# Patient Record
Sex: Female | Born: 1945 | Race: Black or African American | Hispanic: No | Marital: Single | Smoking: Former smoker
Health system: Southern US, Community
[De-identification: ages and names within clinical notes are randomized; demographics above are authoritative.]

## PROBLEM LIST (undated history)

## (undated) DIAGNOSIS — Z789 Other specified health status: Secondary | ICD-10-CM

## (undated) HISTORY — PX: NO PAST SURGERIES: SHX2092

---

## 2006-05-29 ENCOUNTER — Emergency Department (HOSPITAL_COMMUNITY): Admission: EM | Admit: 2006-05-29 | Discharge: 2006-05-30 | Payer: Self-pay | Admitting: Emergency Medicine

## 2009-04-15 ENCOUNTER — Emergency Department (HOSPITAL_BASED_OUTPATIENT_CLINIC_OR_DEPARTMENT_OTHER): Admission: EM | Admit: 2009-04-15 | Discharge: 2009-04-15 | Payer: Self-pay | Admitting: Emergency Medicine

## 2010-11-05 ENCOUNTER — Emergency Department (HOSPITAL_COMMUNITY)
Admission: EM | Admit: 2010-11-05 | Discharge: 2010-11-06 | Payer: Self-pay | Source: Home / Self Care | Admitting: Emergency Medicine

## 2010-11-06 DIAGNOSIS — F4320 Adjustment disorder, unspecified: Secondary | ICD-10-CM

## 2010-11-06 DIAGNOSIS — F29 Unspecified psychosis not due to a substance or known physiological condition: Secondary | ICD-10-CM

## 2010-11-07 LAB — CBC
HCT: 43.3 % (ref 36.0–46.0)
Hemoglobin: 14.4 g/dL (ref 12.0–15.0)
MCH: 31.4 pg (ref 26.0–34.0)
MCHC: 33.3 g/dL (ref 30.0–36.0)
MCV: 94.5 fL (ref 78.0–100.0)
Platelets: 329 10*3/uL (ref 150–400)
RBC: 4.58 MIL/uL (ref 3.87–5.11)
RDW: 14.4 % (ref 11.5–15.5)
WBC: 4 10*3/uL (ref 4.0–10.5)

## 2010-11-07 LAB — URINALYSIS, ROUTINE W REFLEX MICROSCOPIC
Bilirubin Urine: NEGATIVE
Hgb urine dipstick: NEGATIVE
Ketones, ur: NEGATIVE mg/dL
Nitrite: NEGATIVE
Protein, ur: NEGATIVE mg/dL
Specific Gravity, Urine: 1.016 (ref 1.005–1.030)
Urine Glucose, Fasting: NEGATIVE mg/dL
Urobilinogen, UA: 0.2 mg/dL (ref 0.0–1.0)
pH: 5.5 (ref 5.0–8.0)

## 2010-11-07 LAB — COMPREHENSIVE METABOLIC PANEL
ALT: 14 U/L (ref 0–35)
AST: 26 U/L (ref 0–37)
Albumin: 4.4 g/dL (ref 3.5–5.2)
Alkaline Phosphatase: 56 U/L (ref 39–117)
BUN: 8 mg/dL (ref 6–23)
CO2: 23 mEq/L (ref 19–32)
Calcium: 9.4 mg/dL (ref 8.4–10.5)
Chloride: 111 mEq/L (ref 96–112)
Creatinine, Ser: 0.88 mg/dL (ref 0.4–1.2)
GFR calc Af Amer: >60 mL/min (ref >60)
GFR calc non Af Amer: >60 mL/min (ref >60)
Glucose, Bld: 88 mg/dL (ref 70–99)
Potassium: 3.6 mEq/L (ref 3.5–5.1)
Sodium: 147 mEq/L — ABNORMAL HIGH (ref 135–145)
Total Bilirubin: 0.5 mg/dL (ref 0.3–1.2)
Total Protein: 8 g/dL (ref 6.0–8.3)

## 2010-11-07 LAB — DIFFERENTIAL
Basophils Absolute: 0.1 10*3/uL (ref 0.0–0.1)
Basophils Relative: 2 % — ABNORMAL HIGH (ref 0–1)
Eosinophils Absolute: 0 10*3/uL (ref 0.0–0.7)
Eosinophils Relative: 1 % (ref 0–5)
Lymphocytes Relative: 47 % — ABNORMAL HIGH (ref 12–46)
Lymphs Abs: 1.9 10*3/uL (ref 0.7–4.0)
Monocytes Absolute: 0.1 10*3/uL (ref 0.1–1.0)
Monocytes Relative: 3 % (ref 3–12)
Neutro Abs: 1.9 10*3/uL (ref 1.7–7.7)
Neutrophils Relative %: 48 % (ref 43–77)

## 2010-11-07 LAB — RAPID URINE DRUG SCREEN, HOSP PERFORMED
Amphetamines: NOT DETECTED
Barbiturates: NOT DETECTED
Benzodiazepines: NOT DETECTED
Cocaine: NOT DETECTED
Opiates: NOT DETECTED
Tetrahydrocannabinol: NOT DETECTED

## 2010-11-07 LAB — ACETAMINOPHEN LEVEL: Acetaminophen (Tylenol), Serum: <10 ug/mL — ABNORMAL LOW (ref 10–30)

## 2010-11-07 LAB — SALICYLATE LEVEL: Salicylate Lvl: <4 mg/dL (ref 2.8–20.0)

## 2010-11-07 LAB — URINE MICROSCOPIC-ADD ON

## 2010-11-07 LAB — ETHANOL: Alcohol, Ethyl (B): 254 mg/dL — ABNORMAL HIGH (ref 0–10)

## 2010-12-27 ENCOUNTER — Emergency Department (HOSPITAL_COMMUNITY)
Admission: EM | Admit: 2010-12-27 | Discharge: 2010-12-29 | Disposition: A | Discharge status: Other Acute Inpatient Hospital | Payer: Medicare Other | Source: Home / Self Care | Attending: Emergency Medicine | Admitting: Emergency Medicine

## 2010-12-27 DIAGNOSIS — Z8659 Personal history of other mental and behavioral disorders: Secondary | ICD-10-CM | POA: Insufficient documentation

## 2010-12-27 DIAGNOSIS — F1010 Alcohol abuse, uncomplicated: Secondary | ICD-10-CM | POA: Insufficient documentation

## 2010-12-27 LAB — COMPREHENSIVE METABOLIC PANEL
ALT: 11 U/L (ref 0–35)
AST: 20 U/L (ref 0–37)
Albumin: 4.3 g/dL (ref 3.5–5.2)
Alkaline Phosphatase: 41 U/L (ref 39–117)
BUN: 7 mg/dL (ref 6–23)
CO2: 25 mEq/L (ref 19–32)
Calcium: 9.2 mg/dL (ref 8.4–10.5)
Chloride: 110 mEq/L (ref 96–112)
Creatinine, Ser: 0.83 mg/dL (ref 0.4–1.2)
GFR calc Af Amer: >60 mL/min (ref >60)
GFR calc non Af Amer: >60 mL/min (ref >60)
Glucose, Bld: 102 mg/dL — ABNORMAL HIGH (ref 70–99)
Potassium: 3.5 mEq/L (ref 3.5–5.1)
Sodium: 146 mEq/L — ABNORMAL HIGH (ref 135–145)
Total Bilirubin: 0.4 mg/dL (ref 0.3–1.2)
Total Protein: 7.5 g/dL (ref 6.0–8.3)

## 2010-12-27 LAB — RAPID URINE DRUG SCREEN, HOSP PERFORMED
Amphetamines: NOT DETECTED
Barbiturates: NOT DETECTED
Benzodiazepines: NOT DETECTED
Cocaine: NOT DETECTED
Opiates: NOT DETECTED
Tetrahydrocannabinol: NOT DETECTED

## 2010-12-27 LAB — CBC
HCT: 41.2 % (ref 36.0–46.0)
Hemoglobin: 13.9 g/dL (ref 12.0–15.0)
MCH: 32.3 pg (ref 26.0–34.0)
MCHC: 33.7 g/dL (ref 30.0–36.0)
MCV: 95.6 fL (ref 78.0–100.0)
Platelets: 319 10*3/uL (ref 150–400)
RBC: 4.31 MIL/uL (ref 3.87–5.11)
RDW: 14.3 % (ref 11.5–15.5)
WBC: 6.6 10*3/uL (ref 4.0–10.5)

## 2010-12-27 LAB — DIFFERENTIAL
Basophils Absolute: 0 10*3/uL (ref 0.0–0.1)
Basophils Relative: 1 % (ref 0–1)
Eosinophils Absolute: 0 10*3/uL (ref 0.0–0.7)
Eosinophils Relative: 0 % (ref 0–5)
Lymphocytes Relative: 24 % (ref 12–46)
Lymphs Abs: 1.6 10*3/uL (ref 0.7–4.0)
Monocytes Absolute: 0.4 10*3/uL (ref 0.1–1.0)
Monocytes Relative: 5 % (ref 3–12)
Neutro Abs: 4.6 10*3/uL (ref 1.7–7.7)
Neutrophils Relative %: 70 % (ref 43–77)

## 2010-12-27 LAB — ETHANOL: Alcohol, Ethyl (B): 185 mg/dL — ABNORMAL HIGH (ref 0–10)

## 2010-12-28 DIAGNOSIS — F29 Unspecified psychosis not due to a substance or known physiological condition: Secondary | ICD-10-CM

## 2010-12-29 ENCOUNTER — Inpatient Hospital Stay (HOSPITAL_COMMUNITY)
Admission: AD | Admit: 2010-12-29 | Discharge: 2011-01-03 | DRG: 897 | Disposition: A | Discharge status: Home / Self Care | Payer: Medicare Other | Source: Ambulatory Visit | Attending: Psychiatry | Admitting: Psychiatry

## 2010-12-29 DIAGNOSIS — F29 Unspecified psychosis not due to a substance or known physiological condition: Secondary | ICD-10-CM

## 2010-12-29 DIAGNOSIS — I10 Essential (primary) hypertension: Secondary | ICD-10-CM

## 2010-12-29 DIAGNOSIS — F1010 Alcohol abuse, uncomplicated: Secondary | ICD-10-CM

## 2010-12-29 DIAGNOSIS — F10988 Alcohol use, unspecified with other alcohol-induced disorder: Principal | ICD-10-CM

## 2011-01-03 DIAGNOSIS — F1010 Alcohol abuse, uncomplicated: Secondary | ICD-10-CM

## 2011-01-03 DIAGNOSIS — F39 Unspecified mood [affective] disorder: Secondary | ICD-10-CM
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
## 2011-01-30 LAB — URINALYSIS, ROUTINE W REFLEX MICROSCOPIC
Bilirubin Urine: NEGATIVE
Glucose, UA: NEGATIVE mg/dL
Hgb urine dipstick: NEGATIVE
Ketones, ur: NEGATIVE mg/dL
Nitrite: NEGATIVE
Protein, ur: NEGATIVE mg/dL
Specific Gravity, Urine: 1.003 — ABNORMAL LOW (ref 1.005–1.030)
Urobilinogen, UA: 0.2 mg/dL (ref 0.0–1.0)
pH: 5.5 (ref 5.0–8.0)

## 2011-01-30 LAB — BASIC METABOLIC PANEL
BUN: 9 mg/dL (ref 6–23)
CO2: 21 mEq/L (ref 19–32)
Calcium: 9.1 mg/dL (ref 8.4–10.5)
Chloride: 114 mEq/L — ABNORMAL HIGH (ref 96–112)
Creatinine, Ser: 0.9 mg/dL (ref 0.4–1.2)
GFR calc Af Amer: >60 mL/min (ref >60)
GFR calc non Af Amer: >60 mL/min (ref >60)
Glucose, Bld: 126 mg/dL — ABNORMAL HIGH (ref 70–99)
Potassium: 3.6 mEq/L (ref 3.5–5.1)
Sodium: 148 mEq/L — ABNORMAL HIGH (ref 135–145)

## 2011-01-30 LAB — POCT TOXICOLOGY PANEL

## 2011-01-30 LAB — URINE MICROSCOPIC-ADD ON

## 2011-01-30 LAB — DIFFERENTIAL
Basophils Absolute: 0.1 10*3/uL (ref 0.0–0.1)
Basophils Relative: 1 % (ref 0–1)
Eosinophils Absolute: 0 10*3/uL (ref 0.0–0.7)
Eosinophils Relative: 0 % (ref 0–5)
Lymphocytes Relative: 23 % (ref 12–46)
Lymphs Abs: 1.3 10*3/uL (ref 0.7–4.0)
Monocytes Absolute: 0.2 10*3/uL (ref 0.1–1.0)
Monocytes Relative: 4 % (ref 3–12)
Neutro Abs: 4.1 10*3/uL (ref 1.7–7.7)
Neutrophils Relative %: 72 % (ref 43–77)

## 2011-01-30 LAB — CBC
HCT: 42.4 % (ref 36.0–46.0)
Hemoglobin: 14.2 g/dL (ref 12.0–15.0)
MCHC: 33.4 g/dL (ref 30.0–36.0)
MCV: 89.9 fL (ref 78.0–100.0)
Platelets: 315 10*3/uL (ref 150–400)
RBC: 4.71 MIL/uL (ref 3.87–5.11)
RDW: 14.2 % (ref 11.5–15.5)
WBC: 5.7 10*3/uL (ref 4.0–10.5)

## 2011-01-30 LAB — ETHANOL: Alcohol, Ethyl (B): 109 mg/dL — ABNORMAL HIGH (ref 0–10)

## 2012-10-16 IMAGING — CR DG CHEST 2V
2 series · 2 of 2 positions shown · non-contrast
Comparison: Two-view chest x-ray 05/29/2006.

CLINICAL DATA: Intoxicated patient for medical clearance.

CHEST - 2 VIEW 11/05/2010:

[w chest pa]
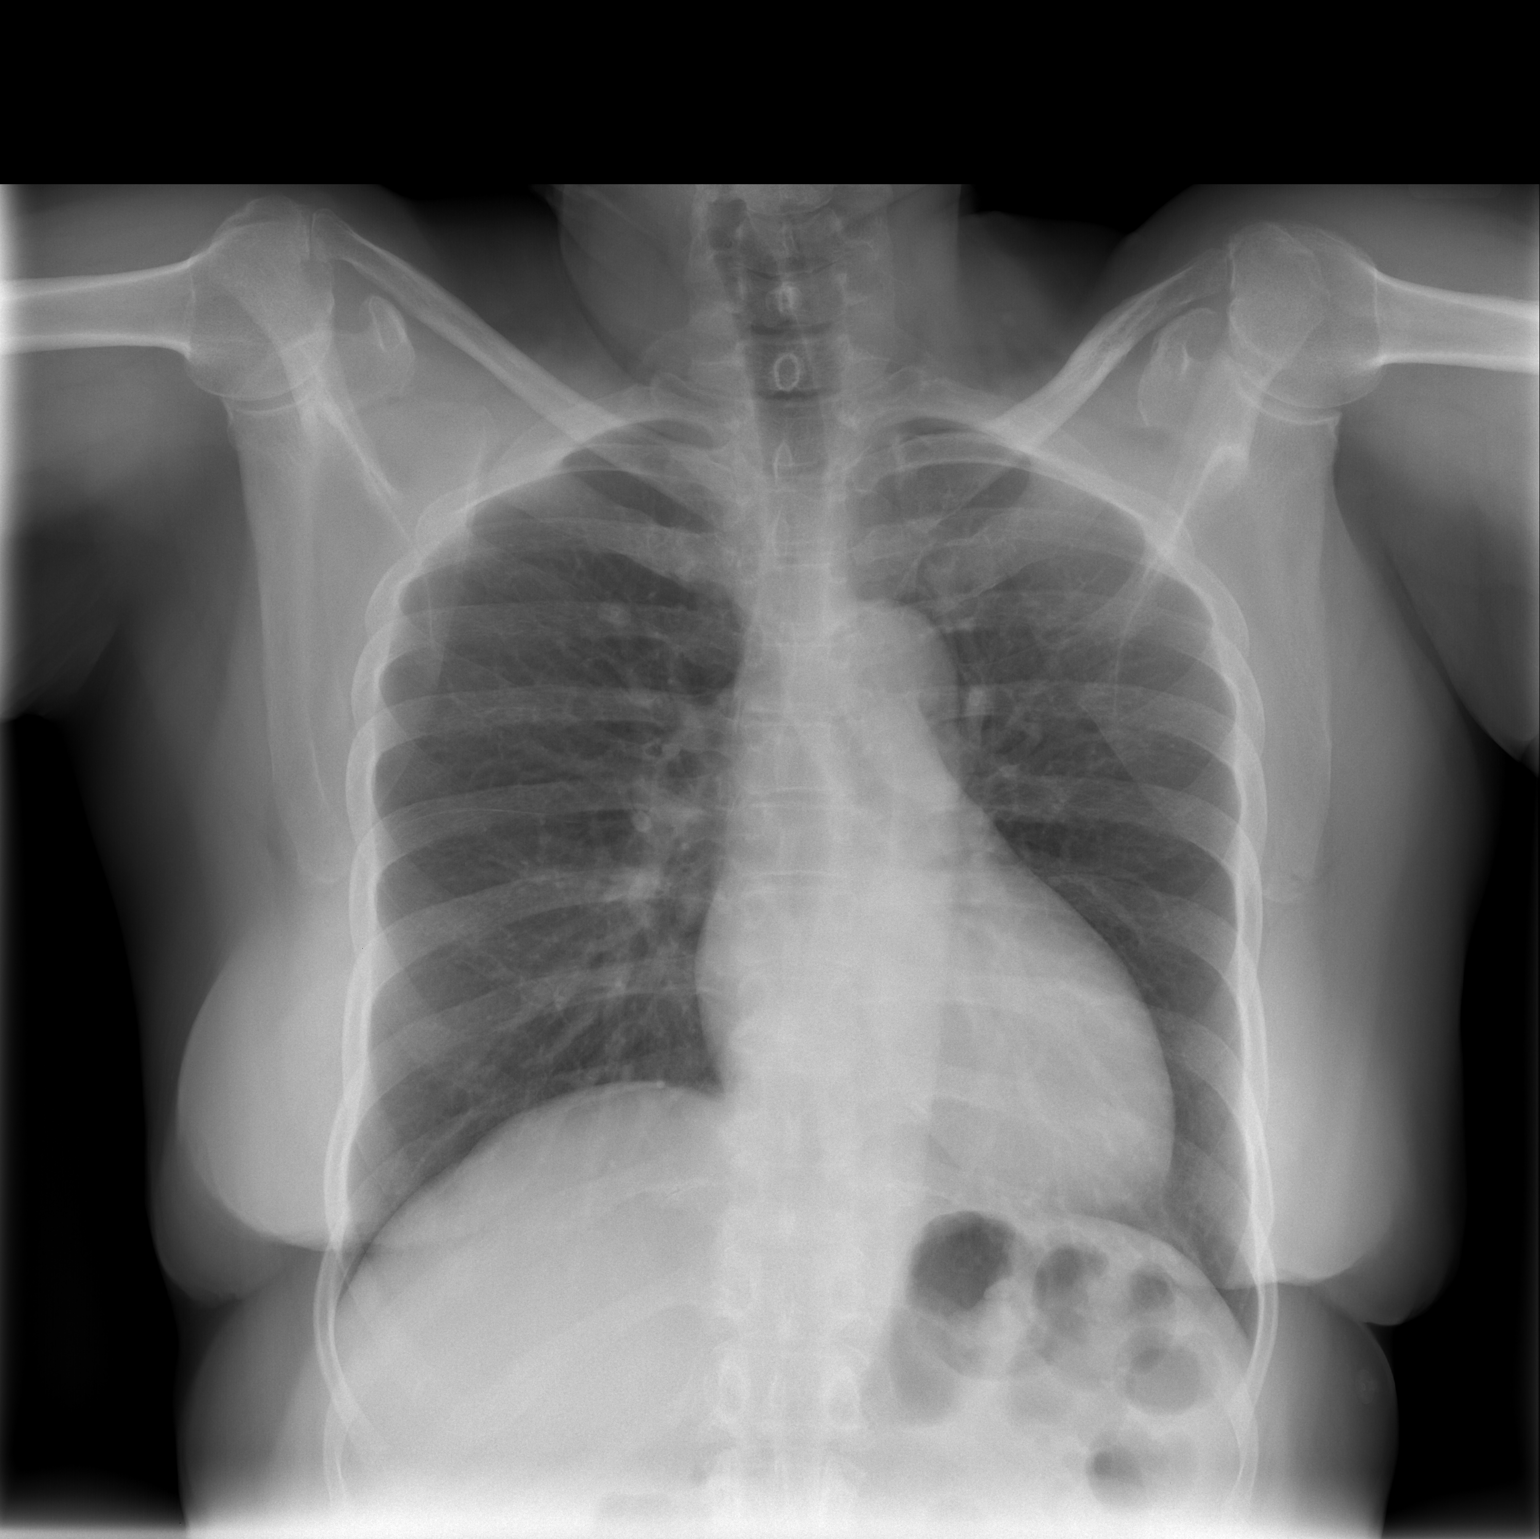

[w chest lat]
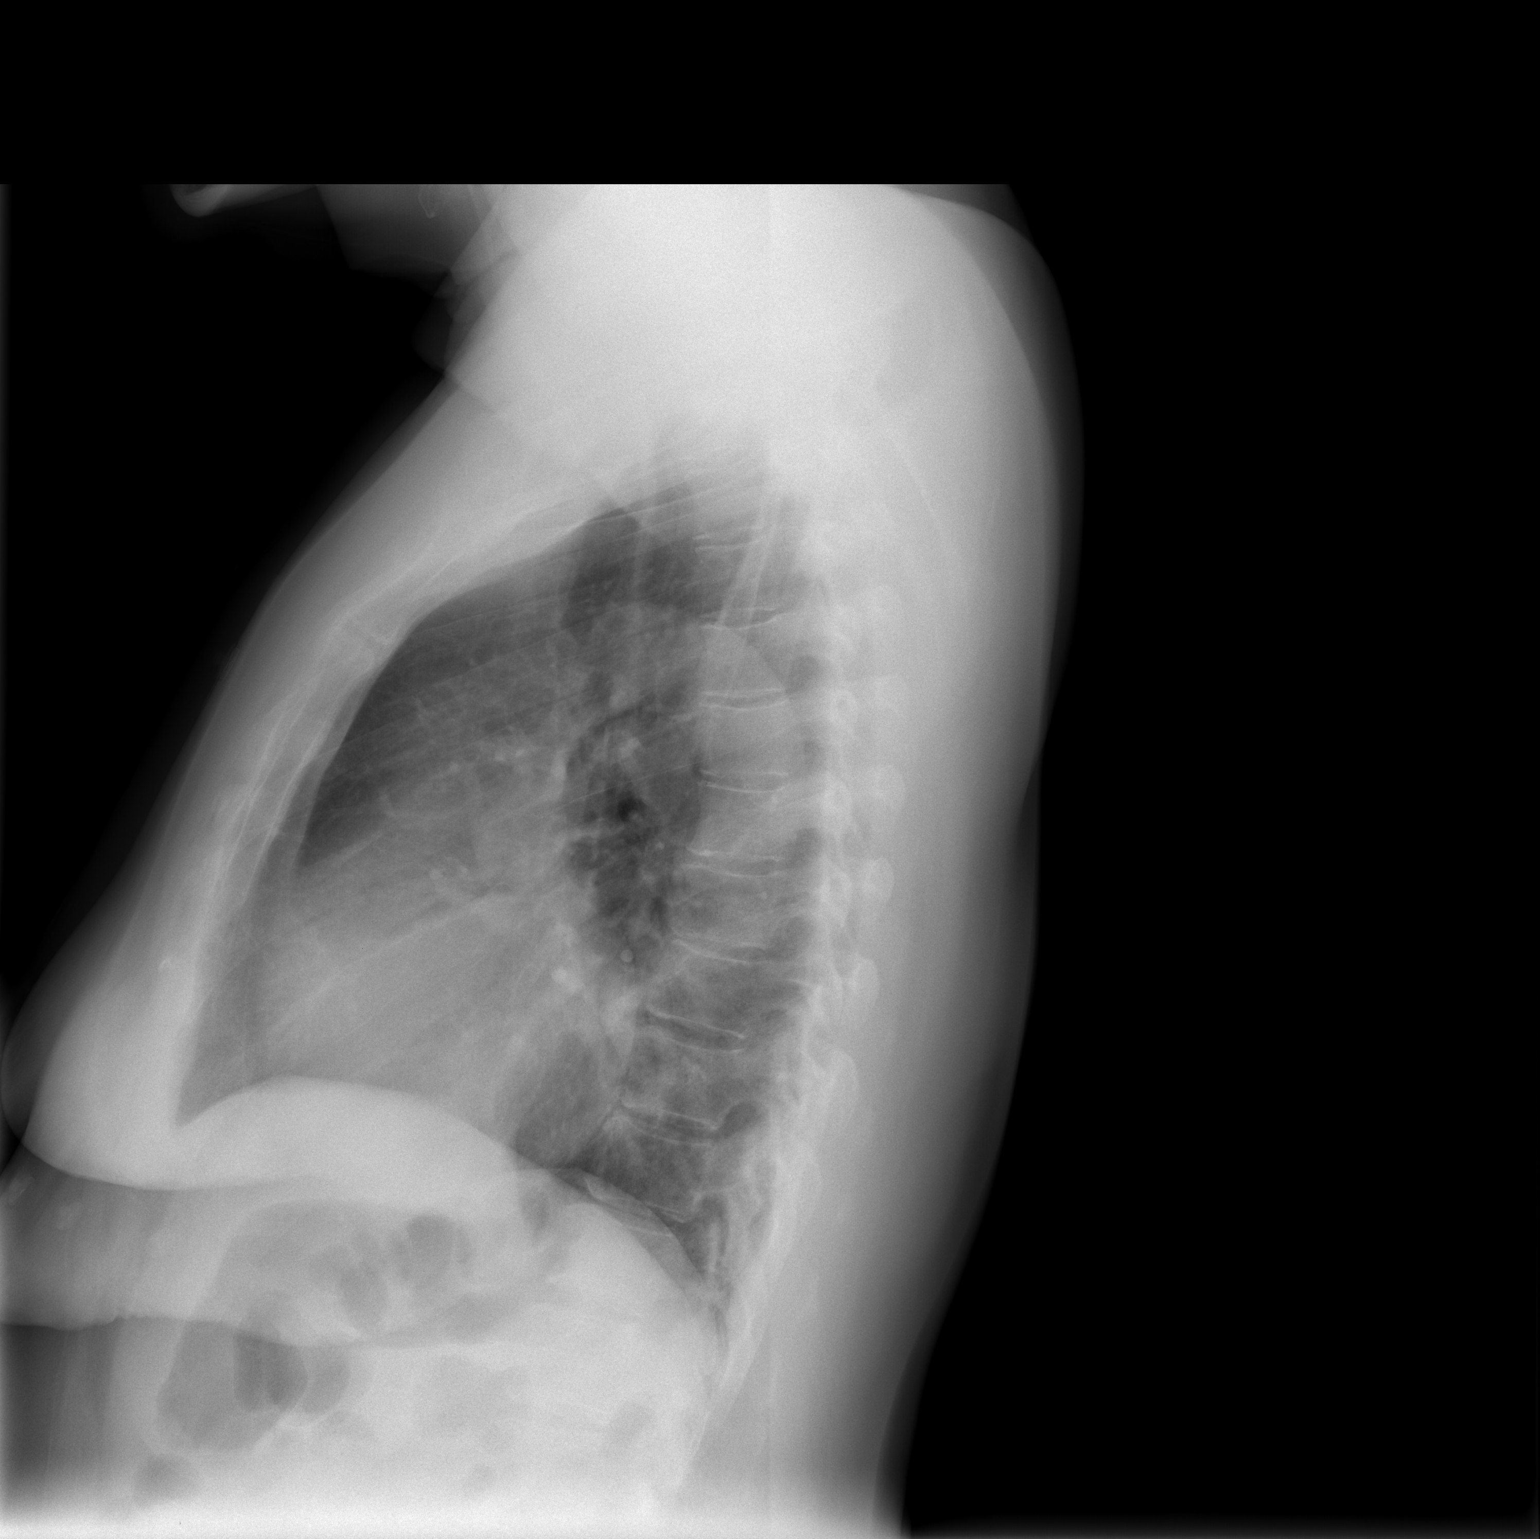

[2 of 2 positions shown; findings below may reference images not displayed]

FINDINGS: Cardiac silhouette mildly enlarged but stable.  Hilar and
mediastinal contours otherwise unremarkable.  Lungs clear.
Bronchovascular markings normal.  Pulmonary vascularity normal.  No
pleural effusions.  Visualized bony thorax intact.  No significant
interval change.
IMPRESSION: Stable mild cardiomegaly.  No acute cardiopulmonary disease.

## 2017-12-30 ENCOUNTER — Inpatient Hospital Stay (HOSPITAL_COMMUNITY)
Admission: EM | Admit: 2017-12-30 | Discharge: 2018-01-04 | DRG: 375 | Disposition: A | Discharge status: Hospice | Payer: Medicare Other | Attending: Internal Medicine | Admitting: Internal Medicine

## 2017-12-30 ENCOUNTER — Emergency Department (HOSPITAL_COMMUNITY): Payer: Medicare Other

## 2017-12-30 ENCOUNTER — Encounter (HOSPITAL_COMMUNITY): Payer: Self-pay | Admitting: Emergency Medicine

## 2017-12-30 ENCOUNTER — Other Ambulatory Visit: Payer: Self-pay

## 2017-12-30 DIAGNOSIS — Z7189 Other specified counseling: Secondary | ICD-10-CM

## 2017-12-30 DIAGNOSIS — J439 Emphysema, unspecified: Secondary | ICD-10-CM | POA: Diagnosis present

## 2017-12-30 DIAGNOSIS — C786 Secondary malignant neoplasm of retroperitoneum and peritoneum: Secondary | ICD-10-CM | POA: Diagnosis not present

## 2017-12-30 DIAGNOSIS — F209 Schizophrenia, unspecified: Secondary | ICD-10-CM | POA: Diagnosis present

## 2017-12-30 DIAGNOSIS — C799 Secondary malignant neoplasm of unspecified site: Secondary | ICD-10-CM | POA: Diagnosis present

## 2017-12-30 DIAGNOSIS — Z681 Body mass index (BMI) 19 or less, adult: Secondary | ICD-10-CM

## 2017-12-30 DIAGNOSIS — N939 Abnormal uterine and vaginal bleeding, unspecified: Secondary | ICD-10-CM | POA: Diagnosis present

## 2017-12-30 DIAGNOSIS — C7989 Secondary malignant neoplasm of other specified sites: Secondary | ICD-10-CM | POA: Diagnosis present

## 2017-12-30 DIAGNOSIS — R770 Abnormality of albumin: Secondary | ICD-10-CM | POA: Diagnosis present

## 2017-12-30 DIAGNOSIS — I700 Atherosclerosis of aorta: Secondary | ICD-10-CM | POA: Diagnosis present

## 2017-12-30 DIAGNOSIS — I10 Essential (primary) hypertension: Secondary | ICD-10-CM | POA: Diagnosis present

## 2017-12-30 DIAGNOSIS — Z87891 Personal history of nicotine dependence: Secondary | ICD-10-CM

## 2017-12-30 DIAGNOSIS — Z515 Encounter for palliative care: Secondary | ICD-10-CM

## 2017-12-30 DIAGNOSIS — E440 Moderate protein-calorie malnutrition: Secondary | ICD-10-CM | POA: Diagnosis present

## 2017-12-30 DIAGNOSIS — C55 Malignant neoplasm of uterus, part unspecified: Secondary | ICD-10-CM | POA: Diagnosis present

## 2017-12-30 DIAGNOSIS — Z8659 Personal history of other mental and behavioral disorders: Secondary | ICD-10-CM

## 2017-12-30 DIAGNOSIS — C787 Secondary malignant neoplasm of liver and intrahepatic bile duct: Secondary | ICD-10-CM | POA: Diagnosis present

## 2017-12-30 DIAGNOSIS — E876 Hypokalemia: Secondary | ICD-10-CM | POA: Diagnosis present

## 2017-12-30 DIAGNOSIS — D649 Anemia, unspecified: Secondary | ICD-10-CM | POA: Diagnosis present

## 2017-12-30 DIAGNOSIS — C7800 Secondary malignant neoplasm of unspecified lung: Secondary | ICD-10-CM | POA: Diagnosis present

## 2017-12-30 DIAGNOSIS — Z5329 Procedure and treatment not carried out because of patient's decision for other reasons: Secondary | ICD-10-CM | POA: Diagnosis not present

## 2017-12-30 DIAGNOSIS — K56600 Partial intestinal obstruction, unspecified as to cause: Secondary | ICD-10-CM | POA: Diagnosis not present

## 2017-12-30 DIAGNOSIS — R591 Generalized enlarged lymph nodes: Secondary | ICD-10-CM | POA: Diagnosis present

## 2017-12-30 DIAGNOSIS — Z809 Family history of malignant neoplasm, unspecified: Secondary | ICD-10-CM

## 2017-12-30 HISTORY — DX: Other specified health status: Z78.9

## 2017-12-30 LAB — COMPREHENSIVE METABOLIC PANEL
ALBUMIN: 2.8 g/dL — AB (ref 3.5–5.0)
ALT: 6 U/L — ABNORMAL LOW (ref 14–54)
AST: 9 U/L — AB (ref 15–41)
Alkaline Phosphatase: 55 U/L (ref 38–126)
Anion gap: 14 (ref 5–15)
BUN: 6 mg/dL (ref 6–20)
CALCIUM: 8.9 mg/dL (ref 8.9–10.3)
CO2: 28 mmol/L (ref 22–32)
Chloride: 94 mmol/L — ABNORMAL LOW (ref 101–111)
Creatinine, Ser: 0.63 mg/dL (ref 0.44–1.00)
GFR calc Af Amer: >60 mL/min (ref >60)
GFR calc non Af Amer: >60 mL/min (ref >60)
GLUCOSE: 122 mg/dL — AB (ref 65–99)
Potassium: 3.2 mmol/L — ABNORMAL LOW (ref 3.5–5.1)
SODIUM: 136 mmol/L (ref 135–145)
Total Bilirubin: 0.7 mg/dL (ref 0.3–1.2)
Total Protein: 7 g/dL (ref 6.5–8.1)

## 2017-12-30 LAB — CBC
HEMATOCRIT: 35.9 % — AB (ref 36.0–46.0)
HEMOGLOBIN: 10.9 g/dL — AB (ref 12.0–15.0)
MCH: 25.6 pg — AB (ref 26.0–34.0)
MCHC: 30.4 g/dL (ref 30.0–36.0)
MCV: 84.5 fL (ref 78.0–100.0)
Platelets: 470 10*3/uL — ABNORMAL HIGH (ref 150–400)
RBC: 4.25 MIL/uL (ref 3.87–5.11)
RDW: 14.2 % (ref 11.5–15.5)
WBC: 6.3 10*3/uL (ref 4.0–10.5)

## 2017-12-30 LAB — URINALYSIS, ROUTINE W REFLEX MICROSCOPIC
Bilirubin Urine: NEGATIVE
Glucose, UA: NEGATIVE mg/dL
Ketones, ur: NEGATIVE mg/dL
NITRITE: NEGATIVE
Protein, ur: 100 mg/dL — AB
pH: 5 (ref 5.0–8.0)

## 2017-12-30 LAB — LIPASE, BLOOD: Lipase: 20 U/L (ref 11–51)

## 2017-12-30 LAB — ETHANOL: Alcohol, Ethyl (B): <10 mg/dL (ref <10)

## 2017-12-30 MED ORDER — BISACODYL 10 MG RE SUPP: 10.0000 mg | Freq: Every day | RECTAL | Status: DC | PRN

## 2017-12-30 MED ORDER — ACETAMINOPHEN 325 MG PO TABS: 650.0000 mg | ORAL_TABLET | Freq: Four times a day (QID) | ORAL | Status: DC | PRN

## 2017-12-30 MED ORDER — ENOXAPARIN SODIUM 40 MG/0.4ML ~~LOC~~ SOLN
40.0000 mg | SUBCUTANEOUS | Status: DC
  Administered 2017-12-31 – 2018-01-03 (×3): 40 mg via SUBCUTANEOUS
  Filled 2017-12-30 (×5): qty 0.4

## 2017-12-30 MED ORDER — ENSURE ENLIVE PO LIQD
237.0000 mL | Freq: Two times a day (BID) | ORAL | Status: DC
  Administered 2017-12-31: 237 mL via ORAL

## 2017-12-30 MED ORDER — HYDROMORPHONE HCL 1 MG/ML IJ SOLN: 0.5000 mg | INTRAMUSCULAR | Status: DC | PRN

## 2017-12-30 MED ORDER — SODIUM CHLORIDE 0.9 % IV BOLUS (SEPSIS)
500.0000 mL | Freq: Once | INTRAVENOUS | Status: AC
  Administered 2017-12-30: 500 mL via INTRAVENOUS

## 2017-12-30 MED ORDER — SODIUM CHLORIDE 0.9 % IV SOLN
INTRAVENOUS | Status: DC
  Administered 2017-12-30 (×2): via INTRAVENOUS

## 2017-12-30 MED ORDER — SENNA 8.6 MG PO TABS
1.0000 | ORAL_TABLET | Freq: Two times a day (BID) | ORAL | Status: DC
  Administered 2017-12-31 – 2018-01-02 (×5): 8.6 mg via ORAL
  Filled 2017-12-30 (×10): qty 1

## 2017-12-30 MED ORDER — POLYETHYLENE GLYCOL 3350 17 G PO PACK: 17.0000 g | PACK | Freq: Every day | ORAL | Status: DC | PRN

## 2017-12-30 MED ORDER — OXYCODONE HCL 5 MG PO TABS: 5.0000 mg | ORAL_TABLET | ORAL | Status: DC | PRN

## 2017-12-30 MED ORDER — POTASSIUM CHLORIDE IN NACL 20-0.9 MEQ/L-% IV SOLN
INTRAVENOUS | Status: DC
  Administered 2017-12-30 – 2017-12-31 (×2): via INTRAVENOUS
  Filled 2017-12-30 (×2): qty 1000

## 2017-12-30 MED ORDER — ONDANSETRON HCL 4 MG/2ML IJ SOLN: 4.0000 mg | Freq: Three times a day (TID) | INTRAMUSCULAR | Status: DC | PRN

## 2017-12-30 MED ORDER — IOPAMIDOL (ISOVUE-300) INJECTION 61%
INTRAVENOUS | Status: AC
  Administered 2017-12-30: 100 mL
  Filled 2017-12-30: qty 100

## 2017-12-30 MED ORDER — ACETAMINOPHEN 650 MG RE SUPP: 650.0000 mg | Freq: Four times a day (QID) | RECTAL | Status: DC | PRN

## 2017-12-30 NOTE — H&P (Addendum)
Date: 12/30/2017               Patient Name:  Amy Haas MRN: [id number]  DOB: August 09, 1946 Age / Sex: 72 y.o., female   PCP: System, Provider Not In         Medical Service: Internal Medicine Teaching Service         Attending Physician: Dr. Heber [place], Rachel Moulds, DO    First Contact: Dr. Frederico Hamman Pager: [phone]  Second Contact: Dr. Jari Favre Pager: [phone]       After Hours (After 5p/  First Contact Pager: [phone]  weekends / holidays): Second Contact Pager: [phone]   Chief Complaint: abd pain  History of Present Illness:  [name] is a 72yo female with PMH of EtOH use, HTN presenting to the ED at prompting of her son for evaluation of abdominal discomfort. Patient reports she has had months of lower abd discomfort, unintentional weight loss, and inability to hold down food due to vomiting/regurgitation. She cannot state that anything makes her symptoms worse or better. She reports not seeing a physician for years and would not have come today had she just moved in with her son 2 days ago who had noticed these symptoms and changes in her. She denies headaches, vision or hearing changes, focal weakness, chest pain, shortness of breath, dysphagia, lymphadenopathy, dysuria, hematuria, constipation, diarrhea. She states she chronically has vaginal spotting, which she states is normal for every woman; she went through menopause in her 48s. She denies ever having a pap smear.   In the ED, she was afebrile, mildly hypertensive; CBC was significant for Hgb to 10.9, plts 470 (previous CBC 7 years ago unremarkable); Bmet showed mild hypokalemia to 3.2, normal renal function; Albumin is down to 2.8 from 4.3 previously; Bili 0.7. Ethanol negative. UA concentrated, TNTC RBC/WBC, rare bac and squames; nitrite negative.   Meds:  Patient not taking meds prior to admission  Allergies: Allergies as of 12/30/2017  . (No Known Allergies)   Past Medical History:  Diagnosis Date  . Patient denies  medical problems    Surgical History: Patient denies prior surgeries  Family History: Patient initially denies family history of any disease or cancers, then at later point states she her brother has cancer but would not elaborate  Social History: Endorses prior history of EtOH use disorder, however states she stopped all alcohol intake about 2 months ago due to not being able to tolerate PO intake. She denies tobacco use and illicit drug use.   Review of Systems: A complete ROS was negative except as per HPI.   Physical Exam: Blood pressure (!) 178/96, pulse (!) 103, temperature 99.3 F (37.4 C), temperature source Oral, resp. rate 16, height 5\' 4"  (1.626 m), weight 111 lb 1.8 oz (50.4 kg), SpO2 98 %. GENERAL- alert, co-operative, appears as stated age, not in any distress. HEENT- Atraumatic, normocephalic, PERRL, EOMI, oral mucosa appears moist, no lymphadenopathy CARDIAC- RRR, no murmurs, rubs or gallops. RESP- Moving equal volumes of air, and clear to auscultation bilaterally, no wheezes or crackles. ABDOMEN- Soft, +BS but decreased, large mass in lower abdomen with overlying tenderness NEURO- CN 2-12 grossly intact; moves all 4 extremities freely EXTREMITIES- pulse 2+, symmetric, no pedal edema. SKIN- Warm, dry, no rash or lesion. Significant skin stretching. PSYCH- Normal mood and affect, appropriate thought content and speech.  EKG: personally reviewed my interpretation is SR, no ST elevation, non-specific TWI  CT chest/abd/pelvis w contrast: 1. Widespread metastatic disease within the  chest, abdomen, and left supraclavicular nodal station. Favor endometrial primary, as evidenced by heterogeneous enlarged uterus (with probable . 2. Abdominal nodal, hepatic, and peritoneal/omental metastasis. Small bowel dilatation proximally, with suspicion of low-grade mid to distal small bowel obstruction, likely secondary to direct uterine tumor involvement. 3. Possible constipation. 4.  Aortic atherosclerosis (ICD10-I70.0) and emphysema (ICD10-J43.9). 5. 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 At current time patient is in denial of diagnosis of cancer, stating she is having abdominal pain from living in a hostile environment for many years. She states doctors only think of cancer because the "evil television" in inundated with talk of cancer and we have no evidence. She states she only trusts older men because they portray authority in a dictatorship type way, and they own property and take care of things. After extensive discussion and explanation she acknowledges the diagnosis of cancer briefly before changing the topic; she requests only management of her abd discomfort with medicine prior to discharge home. She is declining oncology involvement at this time and declining further imaging currently.  CT is suggesting of possible early SBO with dilated loops to 2.5cm which is borderline; she has bowel sounds and only tenderness over lower abd mass; BM history is hard to obtain; she is still at risk for SBO.  --dilaudid for pain, zofran for nausea (qtc wnl); stool softeners; IVF --patient requests liquid diet; at risk for SBO so if symptoms progress and patient agreeable would make NPO, NGT, surgery/onc consult as likely 2/2 tumor burden --will continue discussion tomorrow about involving oncology  Hypokalemia: Mild to 3.2; IVF+KCl  VTE proph: lovenox Code: FULL, patient not participating in code discussion; will need to re-address  Dispo: Admit patient to Observation with expected length of stay less than 2 midnights.  Signed: Alphonzo Grieve, MD 12/30/2017, 9:02 PM Pager [phone]

## 2017-12-30 NOTE — ED Triage Notes (Signed)
Pt. Stated, I have a bladder leak and Ive not been able to eat and my stomach feels bad for a year.

## 2017-12-30 NOTE — ED Notes (Signed)
Pt. Has not seen a doctor in about 6 years.

## 2017-12-30 NOTE — Progress Notes (Signed)
1843 Received pt from ED. Pt alert and oriented. Resting comfortably in bed. Report given to night nurse.

## 2017-12-30 NOTE — ED Provider Notes (Addendum)
[facility] EMERGENCY DEPARTMENT Provider Note   CSN: [id number] Arrival date & time: 12/30/17  0815     History   Chief Complaint Chief Complaint  Patient presents with  . Eating Disorder  . Cystitis  . Abdominal Pain    HPI Amy Haas is a 72 y.o. female.  Patient brought in by her son.  Patient states that she has not been able to eat well and has not felt well for about a year and has been losing weight significantly.  Patient has no primary care doctor and last seen in the emergency department system in 2012.  Patient states that sometimes she vomits does not have much of an appetite for food.  States that her bladder is been leaking.      History reviewed. No pertinent past medical history.  There are no active problems to display for this patient.   History reviewed. No pertinent surgical history.  OB History    No data available       Home Medications    Prior to Admission medications   Not on File    Family History No family history on file.  Social History Social History   Tobacco Use  . Smoking status: Former Research scientist (life sciences)  . Smokeless tobacco: Former Network engineer Use Topics  . Alcohol use: No    Frequency: Never  . Drug use: No     Allergies   Patient has no known allergies.   Review of Systems Review of Systems  Constitutional: Positive for appetite change, fatigue and unexpected weight change. Negative for fever.  HENT: Positive for congestion.   Eyes: Negative for redness.  Respiratory: Negative for shortness of breath.   Cardiovascular: Negative for chest pain.  Gastrointestinal: Positive for abdominal pain.  Genitourinary: Positive for difficulty urinating.  Musculoskeletal: Negative for back pain.  Skin: Negative for rash.  Neurological: Negative for syncope and headaches.  Hematological: Does not bruise/bleed easily.  Psychiatric/Behavioral: Negative for confusion.     Physical Exam Updated  Vital Signs BP (!) 160/83   Pulse 71   Temp 97.9 F (36.6 C) (Oral)   Resp 17   Ht 1.626 m (5\' 4" )   SpO2 97%   Physical Exam  Constitutional: She is oriented to person, place, and time. She appears well-developed and well-nourished.  Thin.  HENT:  Head: Normocephalic and atraumatic.  Mucous membranes dry  Eyes: EOM are normal. Pupils are equal, round, and reactive to light.  Neck: Neck supple.  Cardiovascular: Normal rate, regular rhythm and normal heart sounds.  Pulmonary/Chest: Effort normal and breath sounds normal. No respiratory distress.  Abdominal: Soft. Bowel sounds are normal. She exhibits no distension and no mass. There is no tenderness.  Musculoskeletal: Normal range of motion. She exhibits no edema.  Neurological: She is alert and oriented to person, place, and time. No cranial nerve deficit or sensory deficit. She exhibits normal muscle tone. Coordination normal.  Skin: Skin is warm. No rash noted.  Nursing note and vitals reviewed.    ED Treatments / Results  Labs (all labs ordered are listed, but only abnormal results are displayed) Labs Reviewed  COMPREHENSIVE METABOLIC PANEL - Abnormal; Notable for the following components:      Result Value   Potassium 3.2 (*)    Chloride 94 (*)    Glucose, Bld 122 (*)    Albumin 2.8 (*)    AST 9 (*)    ALT 6 (*)    All other  components within normal limits  CBC - Abnormal; Notable for the following components:   Hemoglobin 10.9 (*)    HCT 35.9 (*)    MCH 25.6 (*)    Platelets 470 (*)    All other components within normal limits  LIPASE, BLOOD  ETHANOL  URINALYSIS, ROUTINE W REFLEX MICROSCOPIC    EKG  EKG Interpretation None       Radiology Ct Chest W Contrast  Result Date: 12/30/2017 CLINICAL DATA:  Unintentional weight loss. Bladder leakage. Inability to eat. Chest pain. EXAM: CT CHEST, ABDOMEN, AND PELVIS WITH CONTRAST TECHNIQUE: Multidetector CT imaging of the chest, abdomen and pelvis was performed  following the standard protocol during bolus administration of intravenous contrast. CONTRAST:  170mL ISOVUE-300 IOPAMIDOL (ISOVUE-300) INJECTION 61% COMPARISON:  Chest radiograph of 11/05/2010. FINDINGS: CT CHEST FINDINGS Cardiovascular: Aortic atherosclerosis. Tortuous thoracic aorta. Borderline cardiomegaly, without pericardial effusion. No central pulmonary embolism, on this non-dedicated study. Mediastinum/Nodes: Left supraclavicular node is suspicious and mildly enlarged at 8 mm on image 8/3. No mediastinal or hilar adenopathy. Lungs/Pleura: No pleural fluid. Mild centrilobular emphysema. Scattered bilateral pulmonary nodules. A nodule which straddles the right upper and right middle lobes measures 2.0 x 2.0 cm on image 67/4. Index right lower lobe pulmonary nodule measures 9 mm on image 93/4. Musculoskeletal: No acute osseous abnormality. CT ABDOMEN PELVIS FINDINGS Hepatobiliary: Hypoattenuating bilateral liver lesions with subtle peripheral enhancement. Example within the anterior segment right liver lobe 1.5 cm on image 54/3. A medial segment left liver lobe 7 mm lesion on image 49/3. Focal steatosis adjacent the falciform ligament. Normal gallbladder, without biliary ductal dilatation. Pancreas: Normal, without mass or ductal dilatation. Spleen: Normal in size, without focal abnormality. Adrenals/Urinary Tract: Bilateral adrenal thickening, without well-defined nodule. Normal kidneys, without hydronephrosis. Normal urinary bladder. Stomach/Bowel: Gastric antral underdistention. Colonic stool burden suggests constipation. Scattered colonic diverticula. Normal terminal ileum. Fluid-filled mid small bowel loops measure up to 2.5 cm, upper normal. There is intimate association of ileal loops with the pelvic mass below, and low-grade obstruction cannot be excluded, including on image 102/3. Vascular/Lymphatic: Aortic and branch vessel atherosclerosis. Bulky retroperitoneal adenopathy. An aortocaval nodal mass  measures 3.0 x 2.4 cm on image 77/3. Left periaortic node measures 1.5 cm on image 68/3. Reproductive: Markedly abnormal appearance of the uterus, which is enlarged and heterogeneous with probable central necrosis, including on image 97/3. Ovaries not confidently identified separate from the abnormal uterus. Other: Mild pelvic floor laxity. Small volume pelvic fluid. Mesenteric implant or node measures 1.6 cm on image 74/3. An omental nodule in the left-sided abdomen measures 11 mm on image 83/3. Musculoskeletal: No acute osseous abnormality. IMPRESSION: 1. Widespread metastatic disease within the chest, abdomen, and left supraclavicular nodal station. Favor endometrial primary, as evidenced by heterogeneous enlarged uterus. 2. Abdominal nodal, hepatic, and peritoneal/omental metastasis. Small bowel dilatation proximally, with suspicion of low-grade mid to distal small bowel obstruction, likely secondary to direct uterine tumor involvement. 3.  Possible constipation. 4. Aortic atherosclerosis (ICD10-I70.0) and emphysema (ICD10-J43.9). 5. Small volume pelvic fluid. Electronically Signed   By: Abigail Miyamoto M.D.   On: 12/30/2017 13:58   Ct Abdomen Pelvis W Contrast  Result Date: 12/30/2017 CLINICAL DATA:  Unintentional weight loss. Bladder leakage. Inability to eat. Chest pain. EXAM: CT CHEST, ABDOMEN, AND PELVIS WITH CONTRAST TECHNIQUE: Multidetector CT imaging of the chest, abdomen and pelvis was performed following the standard protocol during bolus administration of intravenous contrast. CONTRAST:  161mL ISOVUE-300 IOPAMIDOL (ISOVUE-300) INJECTION 61% COMPARISON:  Chest radiograph of  11/05/2010. FINDINGS: CT CHEST FINDINGS Cardiovascular: Aortic atherosclerosis. Tortuous thoracic aorta. Borderline cardiomegaly, without pericardial effusion. No central pulmonary embolism, on this non-dedicated study. Mediastinum/Nodes: Left supraclavicular node is suspicious and mildly enlarged at 8 mm on image 8/3. No  mediastinal or hilar adenopathy. Lungs/Pleura: No pleural fluid. Mild centrilobular emphysema. Scattered bilateral pulmonary nodules. A nodule which straddles the right upper and right middle lobes measures 2.0 x 2.0 cm on image 67/4. Index right lower lobe pulmonary nodule measures 9 mm on image 93/4. Musculoskeletal: No acute osseous abnormality. CT ABDOMEN PELVIS FINDINGS Hepatobiliary: Hypoattenuating bilateral liver lesions with subtle peripheral enhancement. Example within the anterior segment right liver lobe 1.5 cm on image 54/3. A medial segment left liver lobe 7 mm lesion on image 49/3. Focal steatosis adjacent the falciform ligament. Normal gallbladder, without biliary ductal dilatation. Pancreas: Normal, without mass or ductal dilatation. Spleen: Normal in size, without focal abnormality. Adrenals/Urinary Tract: Bilateral adrenal thickening, without well-defined nodule. Normal kidneys, without hydronephrosis. Normal urinary bladder. Stomach/Bowel: Gastric antral underdistention. Colonic stool burden suggests constipation. Scattered colonic diverticula. Normal terminal ileum. Fluid-filled mid small bowel loops measure up to 2.5 cm, upper normal. There is intimate association of ileal loops with the pelvic mass below, and low-grade obstruction cannot be excluded, including on image 102/3. Vascular/Lymphatic: Aortic and branch vessel atherosclerosis. Bulky retroperitoneal adenopathy. An aortocaval nodal mass measures 3.0 x 2.4 cm on image 77/3. Left periaortic node measures 1.5 cm on image 68/3. Reproductive: Markedly abnormal appearance of the uterus, which is enlarged and heterogeneous with probable central necrosis, including on image 97/3. Ovaries not confidently identified separate from the abnormal uterus. Other: Mild pelvic floor laxity. Small volume pelvic fluid. Mesenteric implant or node measures 1.6 cm on image 74/3. An omental nodule in the left-sided abdomen measures 11 mm on image 83/3.  Musculoskeletal: No acute osseous abnormality. IMPRESSION: 1. Widespread metastatic disease within the chest, abdomen, and left supraclavicular nodal station. Favor endometrial primary, as evidenced by heterogeneous enlarged uterus. 2. Abdominal nodal, hepatic, and peritoneal/omental metastasis. Small bowel dilatation proximally, with suspicion of low-grade mid to distal small bowel obstruction, likely secondary to direct uterine tumor involvement. 3.  Possible constipation. 4. Aortic atherosclerosis (ICD10-I70.0) and emphysema (ICD10-J43.9). 5. Small volume pelvic fluid. Electronically Signed   By: Abigail Miyamoto M.D.   On: 12/30/2017 13:58    Procedures Procedures (including critical care time)  Medications Ordered in ED Medications  0.9 %  sodium chloride infusion ( Intravenous New Bag/Given 12/30/17 1200)  sodium chloride 0.9 % bolus 500 mL (0 mLs Intravenous Stopped 12/30/17 1410)  iopamidol (ISOVUE-300) 61 % injection (100 mLs  Contrast Given 12/30/17 1300)     Initial Impression / Assessment and Plan / ED Course  I have reviewed the triage vital signs and the nursing notes.  Pertinent labs & imaging results that were available during my care of the patient were reviewed by me and considered in my medical decision making (see chart for details).    Patient's history very concerning for some sort of neoplastic process.  CT chest and abdomen shows extensive metastatic disease.  Radiology suspects it may be of uterine origin.  Also some concern for perhaps an early partial small bowel obstruction.  No clinical acute findings to suggest obstruction at this time.  Discussed with for further workup of the findings.  Internal medicine teaching service they will admit patient patient showing clinically some signs of dehydration IV fluid will be of benefit.  She has been receiving those here  today.  Teaching service will admit.   Final Clinical Impressions(s) / ED Diagnoses   Final diagnoses:    Metastatic cancer [facility])  Partial small bowel obstruction [facility])    ED Discharge Orders    None       Fredia Sorrow, MD 12/30/17 1618    Fredia Sorrow, MD 12/30/17 [phone]

## 2017-12-31 ENCOUNTER — Other Ambulatory Visit: Payer: Self-pay

## 2017-12-31 DIAGNOSIS — F1011 Alcohol abuse, in remission: Secondary | ICD-10-CM | POA: Diagnosis not present

## 2017-12-31 DIAGNOSIS — N939 Abnormal uterine and vaginal bleeding, unspecified: Secondary | ICD-10-CM | POA: Diagnosis present

## 2017-12-31 DIAGNOSIS — F209 Schizophrenia, unspecified: Secondary | ICD-10-CM | POA: Diagnosis present

## 2017-12-31 DIAGNOSIS — N950 Postmenopausal bleeding: Secondary | ICD-10-CM | POA: Diagnosis not present

## 2017-12-31 DIAGNOSIS — K56600 Partial intestinal obstruction, unspecified as to cause: Secondary | ICD-10-CM | POA: Diagnosis present

## 2017-12-31 DIAGNOSIS — C801 Malignant (primary) neoplasm, unspecified: Secondary | ICD-10-CM

## 2017-12-31 DIAGNOSIS — I700 Atherosclerosis of aorta: Secondary | ICD-10-CM | POA: Diagnosis present

## 2017-12-31 DIAGNOSIS — C787 Secondary malignant neoplasm of liver and intrahepatic bile duct: Secondary | ICD-10-CM | POA: Diagnosis present

## 2017-12-31 DIAGNOSIS — C7800 Secondary malignant neoplasm of unspecified lung: Secondary | ICD-10-CM | POA: Diagnosis present

## 2017-12-31 DIAGNOSIS — R591 Generalized enlarged lymph nodes: Secondary | ICD-10-CM | POA: Diagnosis present

## 2017-12-31 DIAGNOSIS — E876 Hypokalemia: Secondary | ICD-10-CM

## 2017-12-31 DIAGNOSIS — C7989 Secondary malignant neoplasm of other specified sites: Secondary | ICD-10-CM | POA: Diagnosis present

## 2017-12-31 DIAGNOSIS — Z681 Body mass index (BMI) 19 or less, adult: Secondary | ICD-10-CM | POA: Diagnosis not present

## 2017-12-31 DIAGNOSIS — C799 Secondary malignant neoplasm of unspecified site: Secondary | ICD-10-CM | POA: Diagnosis not present

## 2017-12-31 DIAGNOSIS — R770 Abnormality of albumin: Secondary | ICD-10-CM | POA: Diagnosis present

## 2017-12-31 DIAGNOSIS — Z809 Family history of malignant neoplasm, unspecified: Secondary | ICD-10-CM | POA: Diagnosis not present

## 2017-12-31 DIAGNOSIS — N858 Other specified noninflammatory disorders of uterus: Secondary | ICD-10-CM | POA: Diagnosis not present

## 2017-12-31 DIAGNOSIS — I10 Essential (primary) hypertension: Secondary | ICD-10-CM

## 2017-12-31 DIAGNOSIS — Z8659 Personal history of other mental and behavioral disorders: Secondary | ICD-10-CM | POA: Diagnosis not present

## 2017-12-31 DIAGNOSIS — C55 Malignant neoplasm of uterus, part unspecified: Secondary | ICD-10-CM | POA: Diagnosis present

## 2017-12-31 DIAGNOSIS — J439 Emphysema, unspecified: Secondary | ICD-10-CM | POA: Diagnosis present

## 2017-12-31 DIAGNOSIS — Z87891 Personal history of nicotine dependence: Secondary | ICD-10-CM | POA: Diagnosis not present

## 2017-12-31 DIAGNOSIS — C786 Secondary malignant neoplasm of retroperitoneum and peritoneum: Secondary | ICD-10-CM | POA: Diagnosis present

## 2017-12-31 DIAGNOSIS — D649 Anemia, unspecified: Secondary | ICD-10-CM | POA: Diagnosis present

## 2017-12-31 DIAGNOSIS — Z7189 Other specified counseling: Secondary | ICD-10-CM | POA: Diagnosis not present

## 2017-12-31 DIAGNOSIS — E440 Moderate protein-calorie malnutrition: Secondary | ICD-10-CM | POA: Diagnosis present

## 2017-12-31 DIAGNOSIS — Z515 Encounter for palliative care: Secondary | ICD-10-CM | POA: Diagnosis present

## 2017-12-31 DIAGNOSIS — Z5329 Procedure and treatment not carried out because of patient's decision for other reasons: Secondary | ICD-10-CM | POA: Diagnosis not present

## 2017-12-31 LAB — BASIC METABOLIC PANEL
Anion gap: 10 (ref 5–15)
BUN: 7 mg/dL (ref 6–20)
CHLORIDE: 98 mmol/L — AB (ref 101–111)
CO2: 27 mmol/L (ref 22–32)
Calcium: 8.3 mg/dL — ABNORMAL LOW (ref 8.9–10.3)
Creatinine, Ser: 0.57 mg/dL (ref 0.44–1.00)
Glucose, Bld: 92 mg/dL (ref 65–99)
POTASSIUM: 3.8 mmol/L (ref 3.5–5.1)
SODIUM: 135 mmol/L (ref 135–145)

## 2017-12-31 LAB — CBC
HEMATOCRIT: 31.9 % — AB (ref 36.0–46.0)
Hemoglobin: 10 g/dL — ABNORMAL LOW (ref 12.0–15.0)
MCH: 26.2 pg (ref 26.0–34.0)
MCHC: 31.3 g/dL (ref 30.0–36.0)
MCV: 83.5 fL (ref 78.0–100.0)
PLATELETS: 415 10*3/uL — AB (ref 150–400)
RBC: 3.82 MIL/uL — AB (ref 3.87–5.11)
RDW: 14 % (ref 11.5–15.5)
WBC: 6.3 10*3/uL (ref 4.0–10.5)

## 2017-12-31 MED ORDER — ADULT MULTIVITAMIN W/MINERALS CH
1.0000 | ORAL_TABLET | Freq: Every day | ORAL | Status: DC
  Administered 2017-12-31 – 2018-01-02 (×3): 1 via ORAL
  Filled 2017-12-31 (×4): qty 1

## 2017-12-31 MED ORDER — DEXTROSE-NACL 5-0.45 % IV SOLN
INTRAVENOUS | Status: DC
  Administered 2017-12-31 – 2018-01-02 (×4): via INTRAVENOUS

## 2017-12-31 NOTE — Progress Notes (Signed)
Initial Nutrition Assessment  DOCUMENTATION CODES:   Non-severe (moderate) malnutrition in context of chronic illness  INTERVENTION:   -Continue Ensure Enlive po BID, each supplement provides 350 kcal and 20 grams of protein -MVI daily  NUTRITION DIAGNOSIS:   Moderate Malnutrition related to chronic illness, cancer and cancer related treatments as evidenced by mild fat depletion, moderate fat depletion, mild muscle depletion, moderate muscle depletion.  GOAL:   Patient will meet greater than or equal to 90% of their needs  MONITOR:   PO intake, Supplement acceptance, Diet advancement, Labs, Weight trends, Skin, I & O's  REASON FOR ASSESSMENT:   Malnutrition Screening Tool    ASSESSMENT:   Amy Haas is a 72yo female with PMH of EtOH use, HTN presenting to the ED at prompting of her son for evaluation of abdominal discomfort. Patient reports she has had months of lower abd discomfort, unintentional weight loss, and inability to hold down food due to vomiting/regurgitation.  Pt admitted with metastatic cancer (possible uterine source).   Case discussed with RN prior to visit, who reports that pt is eating very little and very selective about what she eats. Per RN, dinner tray from yesterday is still sitting in pt room and pt refused offer for lunch or breakfast tray. Pt reluctantly sipping on Ensure and complains that she is being sent too much food.   Spoke with pt at bedside, who was minimally conversant with this Amy Haas. She reports poor intake and weight loss over the past several weeks, however, did not provide further details to this Amy Haas. When attempting to probe for further information, pt became frustrated and stated "I just don't know". She states there is nothing wrong with her appetite, however, is eating less due to inability to keep foods and liquids down.   Pt reports UBW is around 150#; however, unable to provide details regarding amount of weight lost or when wt loss  started.   Noted pt consumed a few sips of broth and about 25% of Ensure supplement. She reports that tray has been there since yesterday "because I don't want to waste food". Pt denies any difficulty keeping down liquids at this time. Pt amenable to continue Ensure supplements.   Labs reviewed.   NUTRITION - FOCUSED PHYSICAL EXAM:    Most Recent Value  Orbital Region  Mild depletion  Upper Arm Region  Moderate depletion  Thoracic and Lumbar Region  Mild depletion  Buccal Region  No depletion  Temple Region  Mild depletion  Clavicle Bone Region  Moderate depletion  Clavicle and Acromion Bone Region  Mild depletion  Scapular Bone Region  Mild depletion  Dorsal Hand  Mild depletion  Patellar Region  Moderate depletion  Anterior Thigh Region  Mild depletion  Posterior Calf Region  Moderate depletion  Edema (Amy Haas Assessment)  None  Hair  Reviewed  Eyes  Reviewed  Mouth  Reviewed  Skin  Reviewed  Nails  Reviewed       Diet Order:  Diet full liquid Room service appropriate? Yes; Fluid consistency: Thin  EDUCATION NEEDS:   Not appropriate for education at this time  Skin:  Skin Assessment: Reviewed RN Assessment  Last BM:  12/31/17  Height:   Ht Readings from Last 1 Encounters:  12/30/17 5\' 4"  (1.626 m)    Weight:   Wt Readings from Last 1 Encounters:  12/30/17 111 lb 1.8 oz (50.4 kg)    Ideal Body Weight:  54.5 kg  BMI:  Body mass index is 19.07  kg/m.  Estimated Nutritional Needs:   Kcal:  1500-1700  Protein:  75-90 grams  Fluid:  1.5-1.7 L    Amy Haas, Amy Haas, Amy Haas, Amy Haas Pager: [phone] After hours Pager: [phone]

## 2017-12-31 NOTE — Progress Notes (Addendum)
I informed her that I am on the cancer team on behalf of Dr. Lindi Adie and was here to discuss her care and current diagnosis and situation.  Mrs. Olvey informed me that she did not want to talk to me, or discuss anything with me.  She informed me that she will talk to her doctor, and she will perhaps consider seeing [place] as an outpatient. She directed me to get any information needed from her doctor or her son.    I informed Mrs. Hurrell that we would be happy to come back by and consult if needed.    Wilber Bihari, NP Nurse Practitioner Medical Oncology [facility] [phone]  Attending Note  I did not physically see the patient.  When my nurse practitioner went to see her she did not want [place] to talk to her. Patient will need a biopsy to determine a treatment plan.  If she does not want to undergo any treatment then there is no need to perform a biopsy.  Given the gravity of her metastatic disease, even with treatment, she may have very low likelihood of benefit.  If she deteriorates physically then hospice and palliative care should be considered. We will not be following her based on patient's wishes.

## 2017-12-31 NOTE — Progress Notes (Signed)
   Subjective:  No acute events overnight. Patient reports she remains unable to tolerate PO intake, but will try to eat today. She also reports ongoing abdominal discomfort. Had a normal BM this AM. She continues to be in denial of cancer diagnosis. Initially resistant to oncology consult. Once team discussed benefits of this, patient seemed agreeable to consult. Son present in room. He understands his mother's diagnosis and prognosis. All questions answered.   Objective:  Vital signs in last 24 hours: Vitals:   12/30/17 2155 12/30/17 2331 12/31/17 0508 12/31/17 1400  BP: (!) 176/83 (!) 158/90 (!) 158/78 (!) 185/85  Pulse: 84  67 69  Resp: 16  16 20   Temp: 97.9 F (36.6 C)  98.8 F (37.1 C) 98.6 F (37 C)  TempSrc: Oral  Oral Oral  SpO2: 100%  100% 100%  Weight:      Height:       Physical Exam  Constitutional: She is oriented to person, place, and time.  Elderly, malnourished female lying in bed in no acute distress   Cardiovascular: Normal rate and regular rhythm. Exam reveals no gallop and no friction rub.  No murmur heard. Pulmonary/Chest: Effort normal and breath sounds normal. No respiratory distress. She has no wheezes. She has no rales.  Abdominal:  Abdomen is soft. Mass palpated on lower abdomen. RLQ tenderness. No distention. Loose skin.   Musculoskeletal: She exhibits no edema.  Neurological: She is alert and oriented to person, place, and time.    Assessment/Plan:  Principal Problem:   Metastatic cancer ([place]) Active Problems:   Malnutrition of moderate degree   Emphysema of lung (HCC)   Aortic atherosclerosis (HCC)   Partial small bowel obstruction ([place])  # Presumed metastatic cancer: possible endometrial as primary. Discussed CT findings with patient. It is unclear to me if she understands her diagnosis and prognosis. Had a long discussion with patient about benefits from discussing her diagnosis and prognosis with oncology team.  Patient initially hesitant  but agreeable to speak with oncology team. However, it appears patient did not want to speak to oncology team when seen this afternoon.  We will continue discussions and supportive care as below.  - Oncology consult, appreciate recommendations  - Continue supportive care: Dilaudid PRN for pain, zofran for nausea, stool softeners, and IVF - Palliative care consult tomorrow    # Partial SOB: continues to endorse nausea and poor appetite.  Remains unable to tolerate p.o. Intake. Last BM this AM.  - Switched fluids to D5 1/2 NS  - Continue CL diet  - Continue Miralax + Senokot QD + Dulcolax   Dispo: Anticipated discharge in approximately 1-2 day(s).   Welford Roche, MD 12/31/2017, 7:38 PM Pager: [phone]

## 2018-01-01 DIAGNOSIS — N858 Other specified noninflammatory disorders of uterus: Secondary | ICD-10-CM

## 2018-01-01 LAB — CBC
HCT: 31.5 % — ABNORMAL LOW (ref 36.0–46.0)
Hemoglobin: 9.5 g/dL — ABNORMAL LOW (ref 12.0–15.0)
MCH: 25.1 pg — ABNORMAL LOW (ref 26.0–34.0)
MCHC: 30.2 g/dL (ref 30.0–36.0)
MCV: 83.1 fL (ref 78.0–100.0)
PLATELETS: 423 10*3/uL — AB (ref 150–400)
RBC: 3.79 MIL/uL — AB (ref 3.87–5.11)
RDW: 13.8 % (ref 11.5–15.5)
WBC: 5.5 10*3/uL (ref 4.0–10.5)

## 2018-01-01 NOTE — Progress Notes (Addendum)
No charge note:  Thank you for this consult.  Chart reviewed- contacted patient's son and arranged [place] meeting for tomorrow morning at 10am.  Please note- per chart review patient possibly has history of schizophrenia, with multiple psych hospitalizations in [place] and a hospitalization in [place] in 2011. Would recommend ensuring family member is present during all consults regarding patient's care.  [name], AGNP-C Palliative Medicine  Please call Palliative Medicine team phone with any questions [phone]. For individual providers please see AMION.

## 2018-01-01 NOTE — Progress Notes (Signed)
   Subjective:  No acute events overnight.  Patient continues to report abdominal discomfort and inability to tolerate p.o. Intake. She does not recall the provider who came yesterday to address new diagnosis of metastatic cancer. In fact, when I addressed this patient became very upset and stated I did not know how to approach her, but she would not specify further. Discussed with patient palliate consult for symptomatic management.   Objective:  Vital signs in last 24 hours: Vitals:   12/31/17 0508 12/31/17 1400 12/31/17 2055 01/01/18 0559  BP: (!) 158/78 (!) 185/85 (!) 161/95 132/75  Pulse: 67 69 84 69  Resp: 16 20 18 18   Temp: 98.8 F (37.1 C) 98.6 F (37 C) 98.4 F (36.9 C) 98.1 F (36.7 C)  TempSrc: Oral Oral Oral Oral  SpO2: 100% 100% 100% 100%  Weight:      Height:       Physical Exam  Constitutional: She is oriented to person, place, and time.  Thin, chronically ill appearing, in no acute distress   HENT:  Head: Normocephalic and atraumatic.  Neck: Normal range of motion. Neck supple.  Pulmonary/Chest: Effort normal. No respiratory distress.  Abdominal: Soft. She exhibits no distension. There is no tenderness.  Soft, suprapubic fullness consistent with uterine mass   Musculoskeletal: She exhibits no edema.  Neurological: She is alert and oriented to person, place, and time.    Assessment/Plan:  Principal Problem:   Metastatic cancer ([place]) Active Problems:   Malnutrition of moderate degree   Emphysema of lung (HCC)   Aortic atherosclerosis (HCC)   Partial small bowel obstruction ([place])  # Presumed metastatic cancer: possible endometrial as primary. Patient continues to be in denial of new cancer diagnosis and states that when medical team attempts to address them. She declined to speak to oncology yesterday. Did not address this again today as patient became upset. Offered palliative care consult to help with symptom management she continues to report abdominal  discomfort and inability to tolerate p.o. Intake. She is agreeable to this. We will continue discussions and supportive care as below.  - Oncology consult, appreciate recommendations  - Continue supportive care: Dilaudid PRN for pain, zofran for nausea, stool softeners, and IVF - Palliative care consult, appreciate recommendations    # Partial SOB: continues to endorse nausea and poor appetite.  Remains unable to tolerate p.o. Intake, but continues to have BM per her report.  - D5 1/2 NS  - Continue CL diet  - Continue Miralax + Senokot QD + Dulcolax   Dispo: Anticipated discharge in approximately 1-2 day(s).   Welford Roche, MD 01/01/2018, 7:52 AM Pager: [phone]

## 2018-01-02 DIAGNOSIS — C799 Secondary malignant neoplasm of unspecified site: Secondary | ICD-10-CM

## 2018-01-02 DIAGNOSIS — Z7189 Other specified counseling: Secondary | ICD-10-CM

## 2018-01-02 DIAGNOSIS — Z515 Encounter for palliative care: Secondary | ICD-10-CM

## 2018-01-02 MED ORDER — HYDROMORPHONE HCL 1 MG/ML IJ SOLN: 0.5000 mg | INTRAMUSCULAR | Status: DC | PRN

## 2018-01-02 MED ORDER — OXYCODONE HCL 5 MG/5ML PO SOLN: 5.0000 mg | ORAL | Status: DC | PRN

## 2018-01-02 MED ORDER — OLANZAPINE 5 MG PO TBDP
5.0000 mg | ORAL_TABLET | Freq: Every day | ORAL | Status: DC
  Administered 2018-01-02: 5 mg via ORAL
  Filled 2018-01-02 (×2): qty 1
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
## 2018-01-02 NOTE — Progress Notes (Signed)
   Subjective:  No acute events overnight. Patient attempting to eat breakfast when seen by the team during rounds. She seems frustrated and states "why do we keep talking about this?" when asked if she has been able to tolerate food. Stated "you don't have common sense" when I attempted to explain the reason of why I was asking her that question. When approached by Dr. Heber [place], patient stated she is feeling somewhat better and has been able to tolerate minimal amounts of food. Discussed with patient palliative care will come see her and help with symptom management.   Objective:  Vital signs in last 24 hours: Vitals:   01/01/18 0559 01/01/18 1328 01/01/18 2102 01/02/18 0453  BP: 132/75 134/73 (!) 148/82 (!) 152/81  Pulse: 69 81 80 77  Resp: '18 16 16 16  '$ Temp: 98.1 F (36.7 C) 98.3 F (36.8 C) 98 F (36.7 C) 98 F (36.7 C)  TempSrc: Oral Oral Oral Oral  SpO2: 100% 99% 100% 100%  Weight:      Height:       Physical Exam  Constitutional: She is oriented to person, place, and time.  Patient lying comfortably in bed about to start breakfast. She is in no acute distress.   Cardiovascular: Normal rate, regular rhythm and normal heart sounds. Exam reveals no gallop and no friction rub.  No murmur heard. Pulmonary/Chest: Effort normal. No respiratory distress.  Abdominal:  Abdomen is soft. There if fullness at lower abdomen. Normoactive bowel sounds.   Musculoskeletal: She exhibits no edema.  Neurological: She is alert and oriented to person, place, and time.    Assessment/Plan:  Principal Problem:   Metastatic cancer ([place]) Active Problems:   Malnutrition of moderate degree   Emphysema of lung (HCC)   Aortic atherosclerosis (HCC)   Partial small bowel obstruction ([place])  # Presumed metastatic cancer: possible endometrial as primary. Patient and son spoke to palliative care team today. I spoke to inpatient hospice RN who met with patient and son Laverna Peace. They would like to pursue home  hospice at this time, but son would like to discuss this with the patient personally. Will continue discussion and ensure safe discharge home with appropriate resources.  - Continue supportive care: DilaudidPRNfor pain, zofran for nausea,stool softeners, andIVF - Palliative care consult, appreciate recommendations: Zyprexa 5 mg QHS and Roxicodone 5 mg q4h PRN  - Referral for case management for home hospice   # Partial SOB: continues to endorse nausea and poor appetite, but has been able to tolerate some PO intake. Continues to have BM per her report.  - D5 1/2 NS  - Continue CL diet  - Continue Miralax + Senokot QD + Dulcolax  Dispo: Anticipated discharge in approximately today-2 day(s).   Welford Roche, MD 01/02/2018, 10:10 AM Pager: [phone]

## 2018-01-03 NOTE — Progress Notes (Signed)
Responded to [facility] to provide support to patient and assist with AD.  Son had requested AD form.  Son not here but is excepted later.  AD form was left with patient.  Chaplain will follow as needed.  Jaclynn Major, [place], [facility], Pager [phone]

## 2018-01-03 NOTE — Progress Notes (Signed)
   Subjective:  No acute events overnight. Patient reports she is doing better and tolerating minimal PO intake. She denies nausea and reports she is having bowel movements when she needs to. Discussed with patient we will speak to son and case manager regarding discharge home.   Objective:  Vital signs in last 24 hours: Vitals:   01/02/18 0453 01/02/18 1710 01/02/18 2055 01/03/18 0601  BP: (!) 152/81 (!) 150/84 (!) 166/90 (!) 148/83  Pulse: 77 80 85 76  Resp: 16 17 18 18   Temp: 98 F (36.7 C) 98.1 F (36.7 C) 99.1 F (37.3 C) 98.9 F (37.2 C)  TempSrc: Oral Oral Oral Oral  SpO2: 100% 100% 100% 99%  Weight:      Height:       *Physical Exam  Constitutional: She is oriented to person, place, and time.  Elderly female that appears chronically-ill lying in bed in no acute distress   Cardiovascular: Normal rate, regular rhythm and normal heart sounds. Exam reveals no gallop and no friction rub.  No murmur heard. Pulmonary/Chest: Effort normal. No respiratory distress.  Abdominal: Soft.  Persistent fullness on lowe abdominal likely from uterine mass. Decreased bowel sounds.   Musculoskeletal: Normal range of motion. She exhibits no edema.  Neurological: She is alert and oriented to person, place, and time.    Assessment/Plan:  Principal Problem:   Metastatic cancer ([place]) Active Problems:   Malnutrition of moderate degree   Emphysema of lung (HCC)   Aortic atherosclerosis (HCC)   Partial small bowel obstruction ([place])   Palliative care by specialist   Goals of care, counseling/discussion  # Presumed metastatic cancer: possible endometrial as primary. Patient and son Laverna Peace) have decided to pursue home hospice after speaking with palliative care team yesterday. He is in the process of signing POA documentation for his mother. I spoke to the case manager, Nira Conn, who will help [place] make appropriate arrangements for home hospice. Expect discharge later today as patient does not  require any equipment for discharge.  - Continue supportive care: Dilaudidand Roxicodone PRNfor pain, Zypreza and zofran for nausea,stool softeners, IVF - Appreciate palliative care consult and recommendations - Appreciate CM help in the care of this patient   # Partial SOB: Denies nausea today and reports having BMs when needed. Will continue symptomatic management as below as patient pursuing home hospice.  - D5 1/2 NS  - Continue CL diet  - Continue Miralax + Senokot QD + Dulcolax  Dispo: Anticipated discharge today.   Welford Roche, MD 01/03/2018, 7:15 AM Pager: [phone]

## 2018-01-03 NOTE — Progress Notes (Signed)
Daily Progress Note   Patient Name: Amy Haas       Date: 01/03/2018 DOB: Jan 03, 1946  Age: 72 y.o. MRN#: [id number] Attending Physician: Lucious Groves, DO Primary Care Physician: System, Provider Not In Admit Date: 12/30/2017  Reason for Consultation/Follow-up: Establishing goals of care  Subjective: Patient resting in bed this morning. No complaints. Hard choices and Adv Directives packet left at bedside. Called and left message for Amy Haas informing him as well. Noted hopeful for discharge home today with Hospice per attending MD note.   ROS  Length of Stay: 3  Current Medications: Scheduled Meds:  . enoxaparin (LOVENOX) injection  40 mg Subcutaneous Q24H  . feeding supplement (ENSURE ENLIVE)  237 mL Oral BID BM  . multivitamin with minerals  1 tablet Oral Daily  . OLANZapine zydis  5 mg Oral QHS  . senna  1 tablet Oral BID    Continuous Infusions: . dextrose 5 % and 0.45% NaCl 50 mL/hr at 01/02/18 1222    PRN Meds: acetaminophen **OR** acetaminophen, bisacodyl, HYDROmorphone (DILAUDID) injection, ondansetron (ZOFRAN) IV, oxyCODONE, polyethylene glycol  Physical Exam  Constitutional:  cachetic  Neurological:  Awake, alert  Skin: Skin is warm and dry.  Nursing note and vitals reviewed.           Vital Signs: BP (!) 148/83 (BP Location: Left Arm)   Pulse 76   Temp 98.9 F (37.2 C) (Oral)   Resp 18   Ht 5\' 4"  (1.626 m)   Wt 50.4 kg (111 lb 1.8 oz)   SpO2 99%   BMI 19.07 kg/m  SpO2: SpO2: 99 % O2 Device: O2 Device: Room Air O2 Flow Rate:    Intake/output summary:   Intake/Output Summary (Last 24 hours) at 01/03/2018 1000 Last data filed at 01/02/2018 1900 Gross per 24 hour  Intake 1260 ml  Output -  Net 1260 ml   LBM: Last BM Date: 01/01/18 Baseline  Weight: Weight: 50.4 kg (111 lb 1.8 oz) Most recent weight: Weight: 50.4 kg (111 lb 1.8 oz)       Palliative Assessment/Data: PPS: 40%    Flowsheet Rows     Most Recent Value  Intake Tab  Referral Department  Hospitalist  Unit at Time of Referral  Med/Surg Unit  Palliative Care Primary Diagnosis  Cancer  Date Notified  01/01/18  Palliative Care Type  New Palliative care  Reason for referral  Pain, Counsel Regarding Hospice, Psychosocial or Spiritual support, Non-pain Symptom, Clarify Goals of Care, Advance Care Planning  Date of Admission  12/30/17  Date first seen by Palliative Care  01/02/18  # of days Palliative referral response time  1 Day(s)  # of days IP prior to Palliative referral  2  Clinical Assessment  Palliative Performance Scale Score  60%  Psychosocial & Spiritual Assessment  Palliative Care Outcomes  Patient/Family meeting held?  Yes  Who was at the meeting?  son  Palliative Care Outcomes  Improved pain interventions, Clarified goals of care, Provided end of life care assistance, Provided psychosocial or spiritual support, Transitioned to hospice, Improved non-pain symptom therapy, Counseled regarding hospice, Provided advance care planning      Patient Active Problem List   Diagnosis Date Noted  . Palliative care by specialist   . Goals of care, counseling/discussion   . Malnutrition of moderate degree 12/31/2017  . Emphysema of lung ([place]) 12/31/2017  . Aortic atherosclerosis ([place]) 12/31/2017  . Partial small bowel obstruction ([place]) 12/31/2017  . Metastatic cancer ([place]) 12/30/2017    Palliative Care Assessment & Plan   Patient Profile:  72 y.o. female  with past medical history of HTN, alcohol abuse, and ?schizophrenia per family admitted on 12/30/2017 with abdominal pain, weight loss, N/V, and loss of appetite. CT scan revealed widespread metastatic disease within chest, abdomen, and pelvis and SBO likely secondary to direct uterine tumor involvement.  Patient lives with her son, Amy Haas. She has not received medical care in six years. She reports she discontinued alcohol use 2 months ago d/t N/V. Palliative medicine consulted for [place].   Assessment/Recommendations/Plan   Continue with plans for d/c home with Hospice   Hospice to discuss code status  Continue symptom mgmt previous recommendations  Thank you for this referral  Goals of Care and Additional Recommendations:  Limitations on Scope of Treatment: Full Comfort Care  Code Status:  Full code  Prognosis:   < 3 months d/t likely cancer on CT scan- large abdominal mass, with likely metastatic disease to chest, pt cachetic, declining biopsy or further treatment  Discharge Planning:  Home with Hospice  Care plan was discussed with patient's son- Amy Haas.  Thank you for allowing the Palliative Medicine Team to assist in the care of this patient.   Time In: 0935 Time Out: 1005 Total Time 30 mins Prolonged Time Billed no      Greater than 50%  of this time was spent counseling and coordinating care related to the above assessment and plan.  [name], AGNP-C Palliative Medicine   Please contact Palliative Medicine Team phone at [phone] for questions and concerns.

## 2018-01-03 NOTE — Discharge Summary (Signed)
Name: Amy Haas MRN: [id number] DOB: [birth year] 72 y.o. PCP: System, Provider Not In  Date of Admission: 12/30/2017 10:35 AM Date of Discharge: 01/04/2018 Attending Physician: Dr. Joni Reining  Discharge Diagnosis: 1. Metastatic cancer Principal Problem:   Metastatic cancer ([place]) Active Problems:   Malnutrition of moderate degree   Emphysema of lung (HCC)   Aortic atherosclerosis (HCC)   Partial small bowel obstruction ([place])   Palliative care by specialist   Goals of care, counseling/discussion  Discharge Medications: Allergies as of 01/04/2018   No Known Allergies     Medication List    TAKE these medications   bisacodyl 10 MG suppository Commonly known as:  DULCOLAX Place 1 suppository (10 mg total) rectally daily as needed for moderate constipation.   feeding supplement (ENSURE ENLIVE) Liqd Take 237 mLs by mouth 2 (two) times daily between meals.   OLANZapine zydis 5 MG disintegrating tablet Commonly known as:  ZYPREXA Take 1 tablet (5 mg total) by mouth at bedtime.   ondansetron 4 MG tablet Commonly known as:  ZOFRAN Take 1 tablet (4 mg total) by mouth daily as needed for nausea or vomiting.   oxyCODONE 5 MG/5ML solution Commonly known as:  ROXICODONE Take 5 mLs (5 mg total) by mouth every 4 (four) hours as needed for moderate pain.   polyethylene glycol packet Commonly known as:  MIRALAX / GLYCOLAX Take 17 g by mouth daily as needed for mild constipation.   senna 8.6 MG Tabs tablet Commonly known as:  SENOKOT Take 1 tablet (8.6 mg total) by mouth 2 (two) times daily.       Disposition and follow-up:   Ms.Amy Haas was discharged from [facility] in [place] condition.  At the hospital follow up visit please address:  1.   Home hospice for likely metastatic cancer --please ensure patient comfort is addressed --continue working with patient's son, Laverna Peace, on establishing goals of care in regards to code status and MOST  form  2.  Labs / imaging needed at time of follow-up: n/a  3.  Pending labs/ test needing follow-up: n/a  Follow-up Appointments: Follow-up Information    [facility] Follow up.   Specialty:  Hospice and Palliative Medicine Why:  Provide home hospice services  Contact information: [place] [phone] [facility] Course by problem list: Principal Problem:   Metastatic cancer [facility]) Active Problems:   Malnutrition of moderate degree   Emphysema of lung (HCC)   Aortic atherosclerosis (HCC)   Partial small bowel obstruction ([place])   Palliative care by specialist   Goals of care, counseling/discussion   Likely metastatic cancer: Patient presented to [facility] at prompting of her son for at least weeks' worth of abd discomfort, unintentional weight loss, nausea, and vomiting. On admission, patients vital signs were stable; she was found to have a normocytic anemia, normal renal function, mild hypokalemia to 3.2 likely 2/2 GI losses, decreased albumin to 2.8 (from 4.3 last known in 2012). Her weight was down 20kg from last known in chart. Patient endorsed intermittent vaginal bleeding since menopause in her 19s. Due to constellation of symptoms, CT chest/abd/pelv was obtained - it showed findings consistent most likely with metastatic cancer (uterus as likely source) involving her liver, omentum, lungs, and  Lymphadenopathy. Imaging also was suggestive of low-grade small bowel obstruction 2/2 direct uterine tumor involvement (however no transition point was identified). After extensive discussion about findings and recommended next  steps, patient was offered further work up for other sites of metastasis, biopsy for identification of primary tumor, and oncology consult for further management, however she declined and elected for maximization of her comfort which we were glad to provide. During her stay, her abdominal pain largely resolved, she was  given IVF and was able to tolerate a full liquid diet. Palliative care was consulted to assist with this transition and ensure patient has proper support; her son, Laverna Peace and patient agreed on pursuing home hospice. Laverna Peace also undertook the task of filling out HCPOA paperwork. Patient remained FULL CODE per her son. With assistance from palliative care, patient was discharged home with oral solution oxycodone, zofran for antiemetic, and stool softeners and home hospice was arranged to take over palliative care route. Patient was also started on low dose antipsychotic, zyprexa, by palliative care as she has a h/o schizophrenia which has not been treated for some years and likely contributed to her late presentation.   Partial small bowel obstruction: CT abd/pel on admission suggestive of partial or low-grade small bowel obstruction. Patient has had resolution of abd pain, n/v and was tolerating full liquid diet with supportive care (IVF, pain management, antiemetics). At discharge she had a bowel movement. She was discharged with stool softeners.  Goals of care: In coordination with her son, Amy Haas, patient was transitioned to home hospice for her presumed metastatic cancer. Her son is pursuing HCPOA. Patient is still Full Code. Please encourage further discussion of code status and MOST form.  Discharge Vitals:   BP (!) 160/83 (BP Location: Left Arm)   Pulse 81   Temp 98.6 F (37 C) (Oral)   Resp 18   Ht 5\' 4"  (1.626 m)   Wt 111 lb 1.8 oz (50.4 kg)   SpO2 100%   BMI 19.07 kg/m   Pertinent Labs, Studies, and Procedures:  CBC Latest Ref Rng & Units 01/01/2018 12/31/2017 12/30/2017  WBC 4.0 - 10.5 K/uL 5.5 6.3 6.3  Hemoglobin 12.0 - 15.0 g/dL 9.5(L) 10.0(L) 10.9(L)  Hematocrit 36.0 - 46.0 % 31.5(L) 31.9(L) 35.9(L)  Platelets 150 - 400 K/uL 423(H) 415(H) 470(H)   BMP Latest Ref Rng & Units 12/31/2017 12/30/2017 12/27/2010  Glucose 65 - 99 mg/dL 92 122(H) 102(H)  BUN 6 - 20 mg/dL 7 6 7     Creatinine 0.44 - 1.00 mg/dL 0.57 0.63 0.83  Sodium 135 - 145 mmol/L 135 136 146(H)  Potassium 3.5 - 5.1 mmol/L 3.8 3.2(L) 3.5  Chloride 101 - 111 mmol/L 98(L) 94(L) 110  CO2 22 - 32 mmol/L 27 28 25   Calcium 8.9 - 10.3 mg/dL 8.3(L) 8.9 9.2   CT chest/abd/pel w/ contast 12/30/17: 1. Widespread metastatic disease within the chest, abdomen, and left supraclavicular nodal station. Favor endometrial primary, as evidenced by heterogeneous enlarged uterus. 2. Abdominal nodal, hepatic, and peritoneal/omental metastasis. Small bowel dilatation proximally, with suspicion of low-grade mid to distal small bowel obstruction, likely secondary to direct uterine tumor involvement. 3.  Possible constipation. 4. Aortic atherosclerosis (ICD10-I70.0) and emphysema (ICD10-J43.9). 5. Small volume pelvic fluid  Discharge Instructions: Discharge Instructions    Diet general   Complete by:  As directed    Discharge instructions   Complete by:  As directed    Ms. Sugarman,   For your abdominal discomfort you can continue taking Dulcolax, Miralax, and Senokot.   For your nausea, you can continue using zofran and zypreza at night.   For abdominal pain you can take the liquid oxycodone every 4  hours as needed.   Please call us if you have any questions.   Increase activity slowly   Complete by:  As directed       Signed: Alphonzo Grieve, MD 01/04/2018, 2:19 PM

## 2018-01-03 NOTE — Care Management Note (Addendum)
Case Management Note  Patient Details  Name: Amy Haas MRN: [id number] Date of Birth: 12/11/1945  Subjective/Objective:                    Action/Plan: Spoke with Amy Haas again . He is making arrangements to have other family members stay with his mother while he is at work. Discharge planned for tomorrow. Provided choice of Hospice agencies. He would like [facility]. Referral made to same ( spoke with [facility]). MD aware. Spoke with son Amy Haas . He has just moved his mother in with him at face sheet address. He does want to take her home with hospice. Amy Haas was driving and needs to make a "few phone calls" before he can discuss which hospice agency. Amy Haas will call NCM back at [phone].Amy Haas aware discharge today or tomorrow am.  Called son Amy Haas [phone] , left voicemail to call CM back at [phone] Expected Discharge Date:                  Expected Discharge Plan:  [place]  In-House Referral:     Discharge planning Services  CM Consult  Post Acute Care Choice:    Choice offered to:  Patient  DME Arranged:    DME Agency:     HH Arranged:    [place] Agency:     Status of Service:  In process, will continue to follow  If discussed at Long Length of Stay Meetings, dates discussed:    Additional Comments:  Amy Favre, RN 01/03/2018, 12:40 PM

## 2018-01-03 NOTE — Progress Notes (Signed)
Pt has been refusing IVF this shift.

## 2018-01-03 NOTE — Progress Notes (Signed)
[facility]  [facility] referral center received request from [facility] for family interest in [facility] services at home after discharge. Chart and patient information reviewed. Hospice eligibility confirmed by [facility] physician.   [facility] RN Freddi [facility] spoke with son Laverna Peace to confirm interest and initiate education related to hospice philosophy, services and team approach to care. Jimmy verbalized understanding of information provided and requested a 3N1 for home use. [facility] equipment manager will order 3N1 through [facility].  [facility] referral center specialist will contact Jimmy to arrange [facility] RN visit at home after discharge. Discharge address has been confirmed and is correct in Epic.  Please send home with patient prescriptions for any medication patient does not already have including comfort medications.  Note continued discussion re Code Status which remains Full Code. If Code Status changes to DNR tomorrow prior to discharge, please send signed and completed out of facility DNR home with patient. Laverna Peace is the contact for DME delivery.   Please call with hospice related questions.   [facility] Liaison will follow up Friday morning.   Thank you,  Erling Conte, LCSW [phone]

## 2018-01-04 DIAGNOSIS — Z8659 Personal history of other mental and behavioral disorders: Secondary | ICD-10-CM

## 2018-01-04 DIAGNOSIS — D649 Anemia, unspecified: Secondary | ICD-10-CM

## 2018-01-04 MED ORDER — ENSURE ENLIVE PO LIQD: 237.0000 mL | Freq: Two times a day (BID) | ORAL | 12 refills | Status: AC

## 2018-01-04 MED ORDER — POLYETHYLENE GLYCOL 3350 17 G PO PACK: 17.0000 g | PACK | Freq: Every day | ORAL | 0 refills | Status: AC | PRN

## 2018-01-04 MED ORDER — BISACODYL 10 MG RE SUPP: 10.0000 mg | Freq: Every day | RECTAL | 0 refills | Status: AC | PRN

## 2018-01-04 MED ORDER — OLANZAPINE 5 MG PO TBDP: 5.0000 mg | ORAL_TABLET | Freq: Every day | ORAL | 0 refills | Status: AC

## 2018-01-04 MED ORDER — SENNA 8.6 MG PO TABS: 1.0000 | ORAL_TABLET | Freq: Two times a day (BID) | ORAL | 0 refills | Status: AC

## 2018-01-04 MED ORDER — OXYCODONE HCL 5 MG/5ML PO SOLN: 5.0000 mg | ORAL | 0 refills | Status: AC | PRN

## 2018-01-04 MED ORDER — ONDANSETRON HCL 4 MG PO TABS: 4.0000 mg | ORAL_TABLET | Freq: Every day | ORAL | 0 refills | Status: AC | PRN

## 2018-01-04 NOTE — Care Management Important Message (Signed)
Important Message  Patient Details  Name: Amy Haas MRN: [id number] Date of Birth: December 21, 1945   Medicare Important Message Given:  Yes    Ashleigh Arya Montine Circle 01/04/2018, 11:20 AM

## 2018-01-04 NOTE — Progress Notes (Signed)
   Subjective:  No acute events. Patient remains stable. This morning patient reports she is doing well. Continues tolerate some PO intake and denies nausea. Reports she is having bowel movements when she needs to. She is medically stable for discharge with home hospice.   Objective:  Vital signs in last 24 hours: Vitals:   01/03/18 0601 01/03/18 1422 01/03/18 2125 01/04/18 0623  BP: (!) 148/83 (!) 149/73 127/64 (!) 144/70  Pulse: 76 78 84 76  Resp: 18 18 18 18   Temp: 98.9 F (37.2 C) 99.1 F (37.3 C) 98.8 F (37.1 C) 98.6 F (37 C)  TempSrc: Oral Oral Oral Oral  SpO2: 99% 99% 98% 99%  Weight:      Height:       Physical Exam  Constitutional: She is oriented to person, place, and time.  Chronically ill appearing female, thin, lying in bed in no acute distress   Cardiovascular: Normal rate, regular rhythm and normal heart sounds. Exam reveals no gallop and no friction rub.  No murmur heard. Pulmonary/Chest: Effort normal. No respiratory distress.  Abdominal: Soft. She exhibits no distension. There is no tenderness.  Fullness on lower abdomen from uterine mass bulkness  Musculoskeletal: She exhibits no edema.  Neurological: She is alert and oriented to person, place, and time.  Skin: Skin is warm. No rash noted.    Assessment/Plan:  Principal Problem:   Metastatic cancer ([place]) Active Problems:   Malnutrition of moderate degree   Emphysema of lung (HCC)   Aortic atherosclerosis (HCC)   Partial small bowel obstruction ([place])   Palliative care by specialist   Goals of care, counseling/discussion  # Presumed metastatic cancer: possible endometrial as primary.Patient and son Laverna Peace) have decided to pursue home hospice after speaking with palliative care team. I spoke to the case manager, Nira Conn, who will help [place] make appropriate arrangements for home hospice. Expect discharge later today as patient does not require any equipment for discharge.  - Continue supportive care:  Dilaudidand Roxicodone PRNfor pain, Zypreza and zofran for nausea,stool softeners, IVF - Appreciate CM help in the care of this patient   # Partial SOB: Denies nausea today and reports having BMs when needed. Will continue symptomatic management as below as patient pursuing home hospice.  - D5 1/2 NS  - Continue CL diet  - Continue Miralax + Senokot QD + Dulcolax  Dispo: Anticipated discharge today.   Welford Roche, MD 01/04/2018, 7:02 AM Pager: [phone]

## 2018-01-04 NOTE — Care Management Note (Signed)
Case Management Note  Patient Details  Name: Amy Haas Haas MRN: [id number] Date of Birth: 07/13/1946  Subjective/Objective:                    Action/Plan: Spoke with patient's son Amy Haas Haas and Amy Haas with [facility]. Both are ready ready for discharge.MD paged  Expected Discharge Date:                  Expected Discharge Plan:  Home w Hospice Care  In-House Referral:     Discharge planning Services  CM Consult  Post Acute Care Choice:    Choice offered to:  Patient  DME Arranged:  3-N-1 DME Agency:  [place]:    [place]:  [facility]   Status of Service:  Completed, signed off  If discussed at [place] of Stay Meetings, dates discussed:    Additional Comments:  Marilu Favre, RN 01/04/2018, 9:50 AM

## 2018-01-04 NOTE — Progress Notes (Signed)
Discharge instructions reviewed with Patient and her son.  Discharge instructions given to Pt's Son.  Pt c/o blood when she was wiping herself during a BM, which was more than usual. VS taken and recorded, MD notified of the blood, but not of the VS.  Pt then asked about the CT scan results.  The Pt's son then stated that all the Pt's care was set up for home and that they were coming out at 5pm and Pt was being discharged, and they were ready to leave.  Pt and son talked and Pt then decided to leave.  Son signed discharged instructions for the Pt, per the Pt's request.  Pt and son received copy of the discharge instructions. Pt left via wheelchair with her son to home.

## 2018-03-23 DEATH — deceased

## 2019-12-11 IMAGING — CT CT ABD-PELV W/ CM
2 of 4 series · 14 of 46 positions shown, 16 images · IV contrast (APPLIED)
Comparison: Chest radiograph of 11/05/2010.

CLINICAL DATA: Unintentional weight loss. Bladder leakage.
Inability to eat. Chest pain.

EXAM:
CT CHEST, ABDOMEN, AND PELVIS WITH CONTRAST
TECHNIQUE: Multidetector CT imaging of the chest, abdomen and pelvis was
performed following the standard protocol during bolus
administration of intravenous contrast.
CONTRAST:  100mL [id number] IOPAMIDOL ([id number]-XZZ) INJECTION 61%

[Series 3: cap 5.0 i31f 2 · axial · 0.71mm/px · z∈[+932,+1462]mm · 11 of 128 slices shown, 13 images]
[im 11/128  soft-tissue]
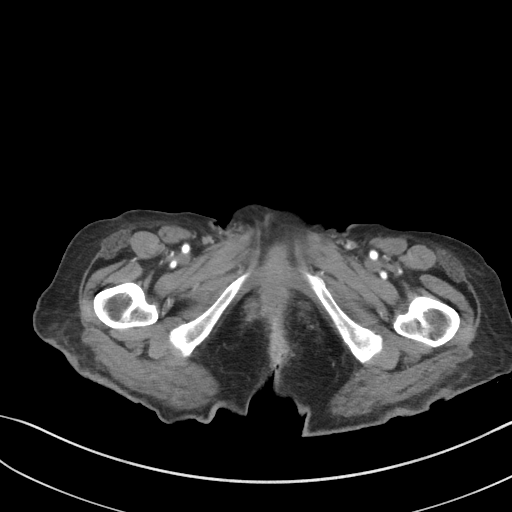
[im 11/128  bone]
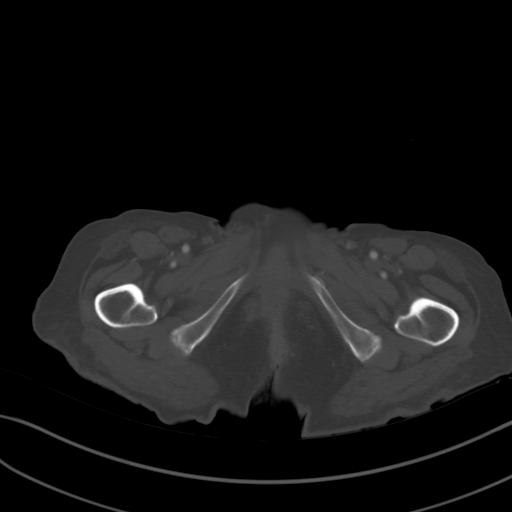
[im 21/128  soft-tissue]
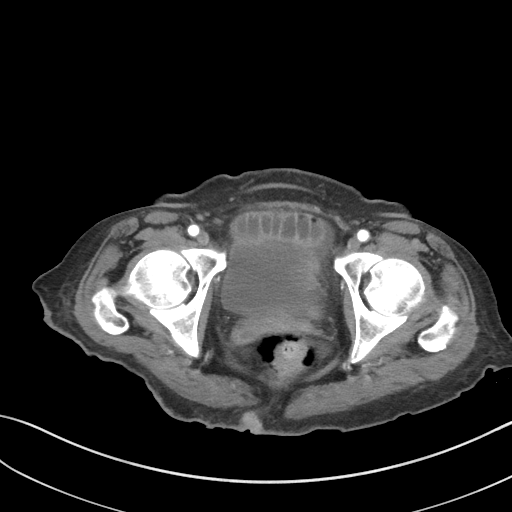
[im 31/128  soft-tissue]
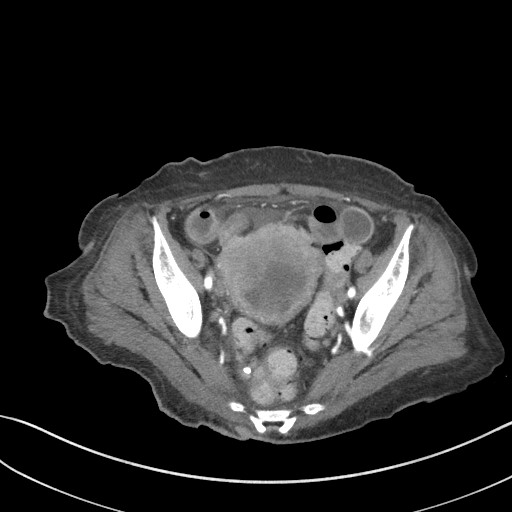
[im 41/128  soft-tissue]
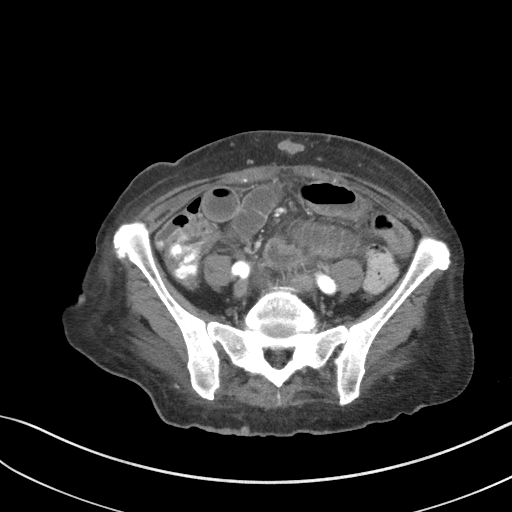
[im 51/128  soft-tissue]
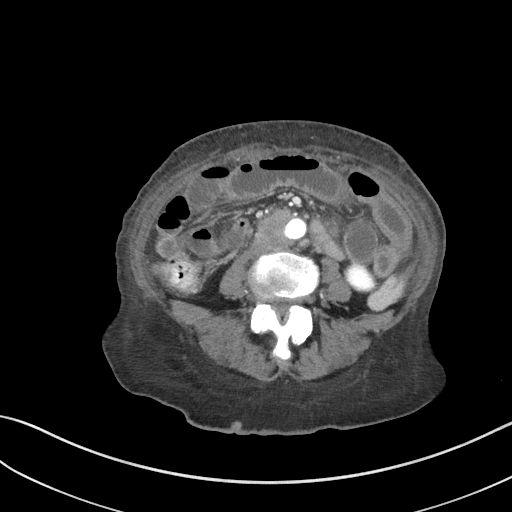
[im 67/128  soft-tissue]
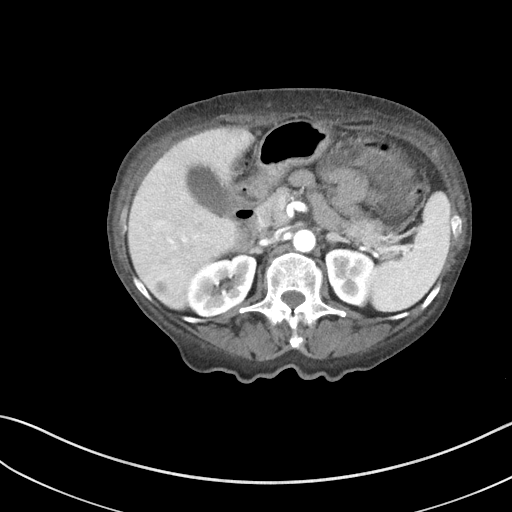
[im 77/128  soft-tissue]
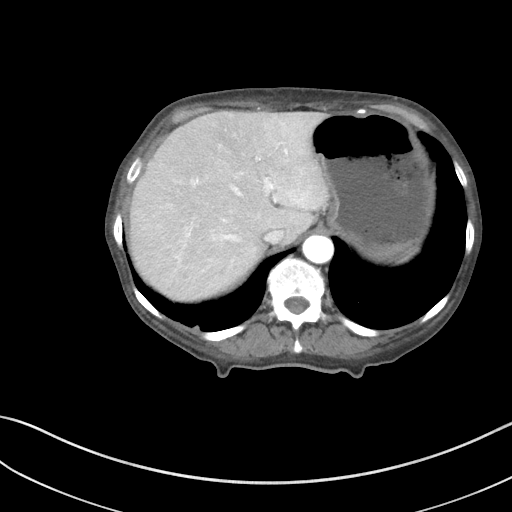
[im 87/128  soft-tissue]
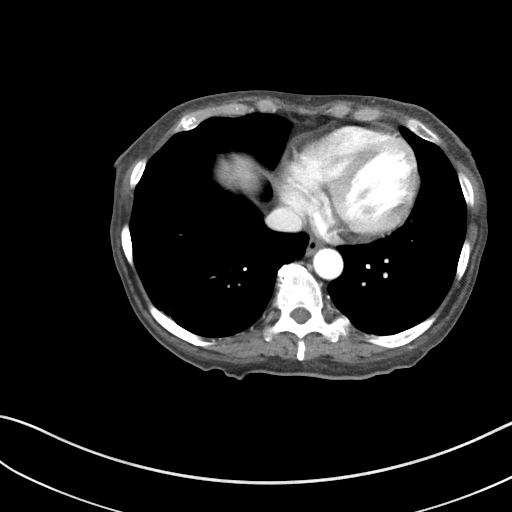
[im 97/128  soft-tissue]
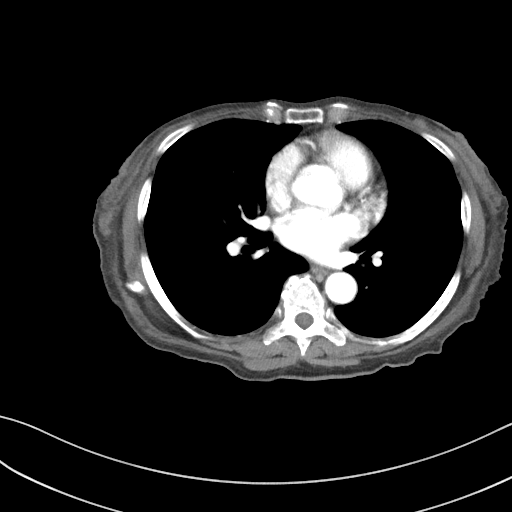
[im 97/128  bone]
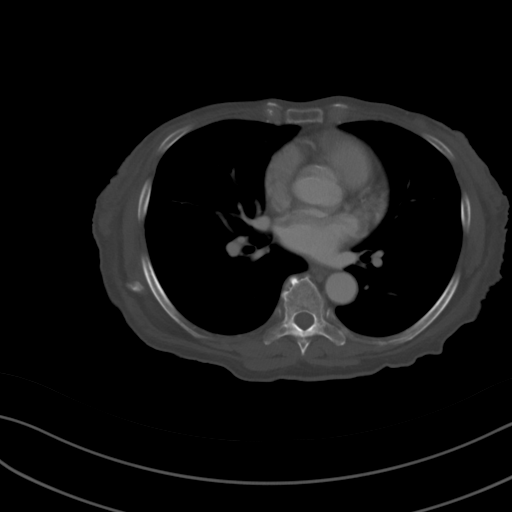
[im 107/128  soft-tissue]
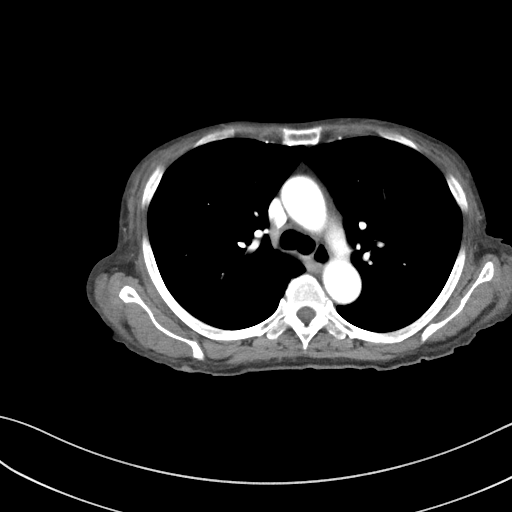
[im 117/128  soft-tissue]
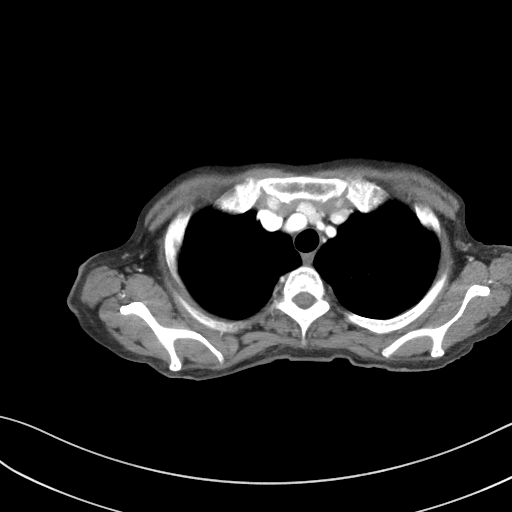

[Series 6: coronal · coronal · 0.85mm/px · 3 of 149 slices shown]
[im 50/149  soft-tissue]
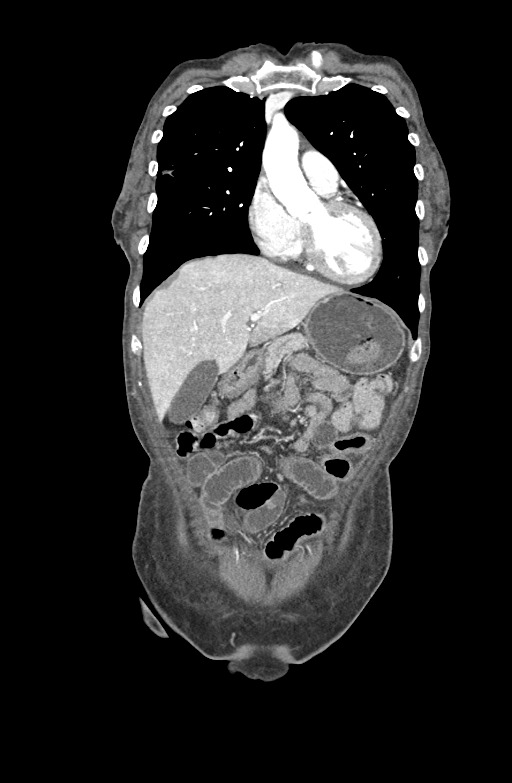
[im 66/149  soft-tissue]
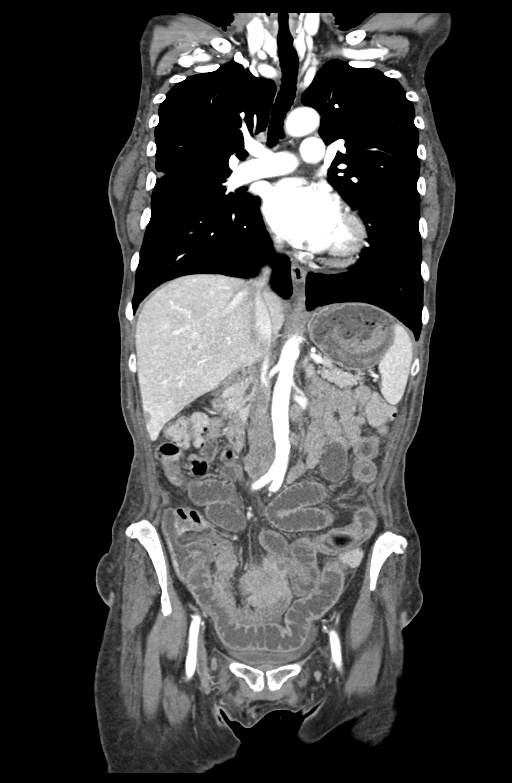
[im 83/149  soft-tissue]
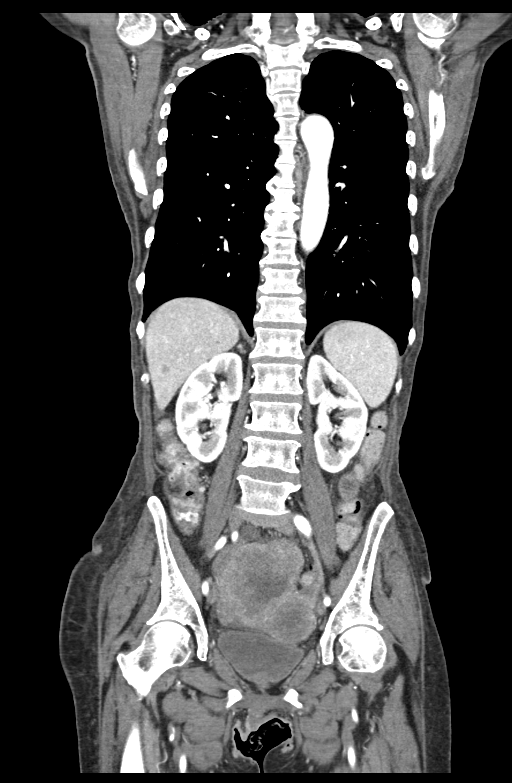

[14 of 46 positions shown; findings below may reference images not displayed]

FINDINGS: CT CHEST FINDINGS

Cardiovascular: Aortic atherosclerosis. Tortuous thoracic aorta.
Borderline cardiomegaly, without pericardial effusion. No central
pulmonary embolism, on this non-dedicated study.

Mediastinum/Nodes: Left supraclavicular node is suspicious and
mildly enlarged at 8 mm on image [DATE]. No mediastinal or hilar
adenopathy.

Lungs/Pleura: No pleural fluid. Mild centrilobular emphysema.
Scattered bilateral pulmonary nodules. A nodule which straddles the
right upper and right middle lobes measures 2.0 x 2.0 cm on image
67/4.

Index right lower lobe pulmonary nodule measures 9 mm on image 93/4.

Musculoskeletal: No acute osseous abnormality.

CT ABDOMEN PELVIS FINDINGS

Hepatobiliary: Hypoattenuating bilateral liver lesions with subtle
peripheral enhancement. Example within the anterior segment right
liver lobe 1.5 cm on image 54/3. A medial segment left liver lobe 7
mm lesion on image 49/3. Focal steatosis adjacent the falciform
ligament. Normal gallbladder, without biliary ductal dilatation.

Pancreas: Normal, without mass or ductal dilatation.

Spleen: Normal in size, without focal abnormality.

Adrenals/Urinary Tract: Bilateral adrenal thickening, without
well-defined nodule. Normal kidneys, without hydronephrosis. Normal
urinary bladder.

Stomach/Bowel: Gastric antral underdistention. Colonic stool burden
suggests constipation. Scattered colonic diverticula. Normal
terminal ileum. Fluid-filled mid small bowel loops measure up to
cm, upper normal. There is intimate association of ileal loops with
the pelvic mass below, and low-grade obstruction cannot be excluded,
including on image 102/3.

Vascular/Lymphatic: Aortic and branch vessel atherosclerosis. Bulky
retroperitoneal adenopathy. An aortocaval nodal mass measures 3.0 x
2.4 cm on image 77/3. Left periaortic node measures 1.5 cm on image
68/3.

Reproductive: Markedly abnormal appearance of the uterus, which is
enlarged and heterogeneous with probable central necrosis, including
on image 97/3. Ovaries not confidently identified separate from the
abnormal uterus.

Other: Mild pelvic floor laxity. Small volume pelvic fluid.
Mesenteric implant or node measures 1.6 cm on image 74/3. An omental
nodule in the left-sided abdomen measures 11 mm on image 83/3.

Musculoskeletal: No acute osseous abnormality.
IMPRESSION: 1. Widespread metastatic disease within the chest, abdomen, and left
supraclavicular nodal station. Favor endometrial primary, as
evidenced by heterogeneous enlarged uterus.
2. Abdominal nodal, hepatic, and peritoneal/omental metastasis.
Small bowel dilatation proximally, with suspicion of low-grade mid
to distal small bowel obstruction, likely secondary to direct
uterine tumor involvement.
3.  Possible constipation.
4. Aortic atherosclerosis (RBQK9-AEM.M) and emphysema (RBQK9-Q0V.J).
5. Small volume pelvic fluid.
